# Patient Record
Sex: Female | Born: 1940
Health system: Southern US, Community
[De-identification: ages and names within clinical notes are randomized; demographics above are authoritative.]

## PROBLEM LIST (undated history)

## (undated) DIAGNOSIS — J449 Chronic obstructive pulmonary disease, unspecified: Secondary | ICD-10-CM

## (undated) DIAGNOSIS — I509 Heart failure, unspecified: Secondary | ICD-10-CM

---

## 2011-09-14 ENCOUNTER — Emergency Department (HOSPITAL_COMMUNITY): Payer: Medicare Other

## 2011-09-14 ENCOUNTER — Inpatient Hospital Stay (HOSPITAL_COMMUNITY)
Admission: EM | Admit: 2011-09-14 | Discharge: 2011-09-22 | DRG: 190 | Disposition: A | Discharge status: Home Health | Payer: Medicare Other | Attending: Internal Medicine | Admitting: Internal Medicine

## 2011-09-14 ENCOUNTER — Other Ambulatory Visit: Payer: Self-pay

## 2011-09-14 ENCOUNTER — Encounter (HOSPITAL_COMMUNITY): Payer: Self-pay | Admitting: Emergency Medicine

## 2011-09-14 DIAGNOSIS — E871 Hypo-osmolality and hyponatremia: Secondary | ICD-10-CM | POA: Diagnosis present

## 2011-09-14 DIAGNOSIS — I2510 Atherosclerotic heart disease of native coronary artery without angina pectoris: Secondary | ICD-10-CM | POA: Diagnosis present

## 2011-09-14 DIAGNOSIS — J9819 Other pulmonary collapse: Secondary | ICD-10-CM | POA: Diagnosis not present

## 2011-09-14 DIAGNOSIS — M7989 Other specified soft tissue disorders: Secondary | ICD-10-CM | POA: Diagnosis not present

## 2011-09-14 DIAGNOSIS — R0902 Hypoxemia: Secondary | ICD-10-CM | POA: Diagnosis not present

## 2011-09-14 DIAGNOSIS — I2789 Other specified pulmonary heart diseases: Secondary | ICD-10-CM | POA: Diagnosis present

## 2011-09-14 DIAGNOSIS — D45 Polycythemia vera: Secondary | ICD-10-CM | POA: Diagnosis present

## 2011-09-14 DIAGNOSIS — D751 Secondary polycythemia: Secondary | ICD-10-CM | POA: Diagnosis not present

## 2011-09-14 DIAGNOSIS — J449 Chronic obstructive pulmonary disease, unspecified: Secondary | ICD-10-CM | POA: Diagnosis not present

## 2011-09-14 DIAGNOSIS — I517 Cardiomegaly: Secondary | ICD-10-CM | POA: Diagnosis not present

## 2011-09-14 DIAGNOSIS — R601 Generalized edema: Secondary | ICD-10-CM | POA: Diagnosis present

## 2011-09-14 DIAGNOSIS — I2781 Cor pulmonale (chronic): Secondary | ICD-10-CM | POA: Diagnosis present

## 2011-09-14 DIAGNOSIS — F17200 Nicotine dependence, unspecified, uncomplicated: Secondary | ICD-10-CM | POA: Diagnosis present

## 2011-09-14 DIAGNOSIS — I2609 Other pulmonary embolism with acute cor pulmonale: Secondary | ICD-10-CM | POA: Diagnosis not present

## 2011-09-14 DIAGNOSIS — J438 Other emphysema: Secondary | ICD-10-CM | POA: Diagnosis not present

## 2011-09-14 DIAGNOSIS — J984 Other disorders of lung: Secondary | ICD-10-CM | POA: Diagnosis not present

## 2011-09-14 DIAGNOSIS — J9620 Acute and chronic respiratory failure, unspecified whether with hypoxia or hypercapnia: Secondary | ICD-10-CM | POA: Diagnosis not present

## 2011-09-14 DIAGNOSIS — R911 Solitary pulmonary nodule: Secondary | ICD-10-CM | POA: Diagnosis present

## 2011-09-14 DIAGNOSIS — R609 Edema, unspecified: Secondary | ICD-10-CM | POA: Diagnosis not present

## 2011-09-14 DIAGNOSIS — J189 Pneumonia, unspecified organism: Secondary | ICD-10-CM | POA: Diagnosis present

## 2011-09-14 DIAGNOSIS — I509 Heart failure, unspecified: Secondary | ICD-10-CM | POA: Diagnosis not present

## 2011-09-14 DIAGNOSIS — J9621 Acute and chronic respiratory failure with hypoxia: Secondary | ICD-10-CM | POA: Diagnosis present

## 2011-09-14 DIAGNOSIS — J90 Pleural effusion, not elsewhere classified: Secondary | ICD-10-CM | POA: Diagnosis not present

## 2011-09-14 DIAGNOSIS — R918 Other nonspecific abnormal finding of lung field: Secondary | ICD-10-CM | POA: Diagnosis not present

## 2011-09-14 LAB — CBC
HCT: 53.3 % — ABNORMAL HIGH (ref 36.0–46.0)
Hemoglobin: 18.2 g/dL — ABNORMAL HIGH (ref 12.0–15.0)
MCH: 32 pg (ref 26.0–34.0)
MCHC: 34.1 g/dL (ref 30.0–36.0)
RBC: 5.69 MIL/uL — ABNORMAL HIGH (ref 3.87–5.11)

## 2011-09-14 LAB — COMPREHENSIVE METABOLIC PANEL
Albumin: 3.2 g/dL — ABNORMAL LOW (ref 3.5–5.2)
BUN: 15 mg/dL (ref 6–23)
Chloride: 94 mEq/L — ABNORMAL LOW (ref 96–112)
Creatinine, Ser: 0.51 mg/dL (ref 0.50–1.10)
GFR calc Af Amer: >90 mL/min (ref >90)
Glucose, Bld: 97 mg/dL (ref 70–99)
Total Bilirubin: 1.2 mg/dL (ref 0.3–1.2)

## 2011-09-14 LAB — URINALYSIS, ROUTINE W REFLEX MICROSCOPIC
Glucose, UA: NEGATIVE mg/dL
Ketones, ur: NEGATIVE mg/dL
Leukocytes, UA: NEGATIVE
Nitrite: NEGATIVE
Protein, ur: NEGATIVE mg/dL
Urobilinogen, UA: 0.2 mg/dL (ref 0.0–1.0)

## 2011-09-14 LAB — DIFFERENTIAL
Basophils Relative: 0 % (ref 0–1)
Eosinophils Absolute: 0 10*3/uL (ref 0.0–0.7)
Lymphs Abs: 0.6 10*3/uL — ABNORMAL LOW (ref 0.7–4.0)
Monocytes Absolute: 0.7 10*3/uL (ref 0.1–1.0)
Monocytes Relative: 8 % (ref 3–12)
Neutro Abs: 8.1 10*3/uL — ABNORMAL HIGH (ref 1.7–7.7)

## 2011-09-14 LAB — PRO B NATRIURETIC PEPTIDE: Pro B Natriuretic peptide (BNP): 5983 pg/mL — ABNORMAL HIGH (ref 0–125)

## 2011-09-14 MED ORDER — ONDANSETRON HCL 4 MG/2ML IJ SOLN: 4.0000 mg | Freq: Four times a day (QID) | INTRAMUSCULAR | Status: DC | PRN

## 2011-09-14 MED ORDER — FUROSEMIDE 10 MG/ML IJ SOLN
40.0000 mg | Freq: Four times a day (QID) | INTRAMUSCULAR | Status: DC
  Administered 2011-09-14 – 2011-09-15 (×4): 40 mg via INTRAVENOUS
  Filled 2011-09-14 (×10): qty 4

## 2011-09-14 MED ORDER — ONDANSETRON HCL 4 MG PO TABS: 4.0000 mg | ORAL_TABLET | Freq: Four times a day (QID) | ORAL | Status: DC | PRN

## 2011-09-14 MED ORDER — ALUM & MAG HYDROXIDE-SIMETH 200-200-20 MG/5ML PO SUSP: 30.0000 mL | Freq: Four times a day (QID) | ORAL | Status: DC | PRN

## 2011-09-14 MED ORDER — ACETAMINOPHEN 650 MG RE SUPP: 650.0000 mg | Freq: Four times a day (QID) | RECTAL | Status: DC | PRN

## 2011-09-14 MED ORDER — FUROSEMIDE 10 MG/ML IJ SOLN
40.0000 mg | Freq: Once | INTRAMUSCULAR | Status: AC
  Administered 2011-09-14: 40 mg via INTRAVENOUS
  Filled 2011-09-14 (×2): qty 4

## 2011-09-14 MED ORDER — NICOTINE 21 MG/24HR TD PT24
21.0000 mg | MEDICATED_PATCH | Freq: Every day | TRANSDERMAL | Status: DC
  Administered 2011-09-15 – 2011-09-16 (×2): 21 mg via TRANSDERMAL
  Filled 2011-09-14 (×2): qty 1

## 2011-09-14 MED ORDER — SODIUM CHLORIDE 0.9 % IJ SOLN
3.0000 mL | INTRAMUSCULAR | Status: DC | PRN
  Administered 2011-09-20: 3 mL via INTRAVENOUS

## 2011-09-14 MED ORDER — ACETAMINOPHEN 325 MG PO TABS
650.0000 mg | ORAL_TABLET | Freq: Four times a day (QID) | ORAL | Status: DC | PRN
  Administered 2011-09-16: 650 mg via ORAL
  Filled 2011-09-14: qty 2

## 2011-09-14 MED ORDER — HYDROCODONE-ACETAMINOPHEN 5-325 MG PO TABS: 1.0000 | ORAL_TABLET | ORAL | Status: DC | PRN

## 2011-09-14 MED ORDER — SODIUM CHLORIDE 0.9 % IJ SOLN
3.0000 mL | Freq: Two times a day (BID) | INTRAMUSCULAR | Status: DC
  Administered 2011-09-14 – 2011-09-21 (×12): 3 mL via INTRAVENOUS

## 2011-09-14 MED ORDER — ZOLPIDEM TARTRATE 5 MG PO TABS: 5.0000 mg | ORAL_TABLET | Freq: Every evening | ORAL | Status: DC | PRN

## 2011-09-14 MED ORDER — SODIUM CHLORIDE 0.9 % IV SOLN: 250.0000 mL | INTRAVENOUS | Status: DC | PRN

## 2011-09-14 MED ORDER — ENOXAPARIN SODIUM 40 MG/0.4ML ~~LOC~~ SOLN
40.0000 mg | Freq: Every day | SUBCUTANEOUS | Status: DC
  Administered 2011-09-15 – 2011-09-16 (×2): 40 mg via SUBCUTANEOUS
  Filled 2011-09-14 (×3): qty 0.4

## 2011-09-14 MED ORDER — BISACODYL 5 MG PO TBEC: 5.0000 mg | DELAYED_RELEASE_TABLET | Freq: Every day | ORAL | Status: DC | PRN

## 2011-09-14 MED ORDER — POLYETHYLENE GLYCOL 3350 17 G PO PACK
17.0000 g | PACK | Freq: Every day | ORAL | Status: DC | PRN
  Filled 2011-09-14: qty 1

## 2011-09-14 MED ORDER — SODIUM CHLORIDE 0.9 % IJ SOLN
3.0000 mL | Freq: Two times a day (BID) | INTRAMUSCULAR | Status: DC
  Administered 2011-09-15 – 2011-09-22 (×7): 3 mL via INTRAVENOUS

## 2011-09-14 NOTE — ED Provider Notes (Signed)
History     CSN: [id number]  Arrival date & time 09/14/11  1403   First MD Initiated Contact with Patient 09/14/11 1506      Chief Complaint  Patient presents with  . Leg Swelling    (Consider location/radiation/quality/duration/timing/severity/associated sxs/prior treatment) The history is provided by the patient.   patient states she's had leg swelling and pain for a while. She states it is worse with walking. No fevers. No cough. She states his been going for a while. It hurts more with walking. She states she has increased trouble breathing with walking. She does smoke a pack a day.  Past Medical History  Diagnosis Date  . Hypertension     History reviewed. No pertinent past surgical history.  Family History  Problem Relation Age of Onset  . Hypertension Father   . Hypertension Sister   . Diabetes Sister     History  Substance Use Topics  . Smoking status: Current Everyday Smoker -- 1.0 packs/day for 49 years    Types: Cigarettes  . Smokeless tobacco: Never Used  . Alcohol Use: No    OB History    Grav Para Term Preterm Abortions TAB SAB Ect Mult Living                  Review of Systems  Constitutional: Negative for appetite change.  Eyes: Negative for itching.  Respiratory: Positive for shortness of breath and wheezing. Negative for choking.   Cardiovascular: Positive for leg swelling. Negative for chest pain.  Gastrointestinal: Positive for abdominal distention. Negative for nausea.  Genitourinary: Negative for hematuria.  Musculoskeletal: Negative for back pain.  Psychiatric/Behavioral: Negative for agitation.    Allergies  Erythromycin; Amoxicillin; and Augmentin  Home Medications   Current Outpatient Rx  Name Route Sig Dispense Refill  . ACETAMINOPHEN 500 MG PO TABS Oral Take 1,000 mg by mouth every 6 (six) hours as needed. pain      BP 136/72  Pulse 78  Temp(Src) 98.1 F (36.7 C) (Oral)  Resp 18  SpO2 98%  Physical Exam  Nursing  note and vitals reviewed. Constitutional: She is oriented to person, place, and time. She appears well-developed and well-nourished.  HENT:  Head: Normocephalic and atraumatic.  Eyes: EOM are normal. Pupils are equal, round, and reactive to light.  Neck: Normal range of motion. Neck supple.  Cardiovascular: Normal rate, regular rhythm and normal heart sounds.   No murmur heard. Pulmonary/Chest: Effort normal. No respiratory distress. She has wheezes. She has no rales.       Diffuse wheezes and prolonged expirations  Abdominal: Soft. Bowel sounds are normal. She exhibits no distension. There is tenderness. There is no rebound and no guarding.        mild diffuse tenderness. Possible hepatomegaly. Some anasarca.   Musculoskeletal: Normal range of motion. She exhibits edema.       Moderate pitting edema from the thighs to the level of the knees bilaterally. Decreased edema with some wasting on bilateral lower extremities from the knees down. Decreased capillary refill from the knees down. Weak dorsalis pedis pulses. His palpable pulse behind her knees  Neurological: She is alert and oriented to person, place, and time. No cranial nerve deficit.  Skin: Skin is warm and dry.  Psychiatric: She has a normal mood and affect. Her speech is normal.    ED Course  Procedures (including critical care time)  Labs Reviewed  CBC - Abnormal; Notable for the following:    RBC 5.69 (*)  Hemoglobin 18.2 (*)    HCT 53.3 (*)    All other components within normal limits  DIFFERENTIAL - Abnormal; Notable for the following:    Neutrophils Relative 86 (*)    Neutro Abs 8.1 (*)    Lymphocytes Relative 6 (*)    Lymphs Abs 0.6 (*)    All other components within normal limits  COMPREHENSIVE METABOLIC PANEL - Abnormal; Notable for the following:    Sodium 133 (*)    Chloride 94 (*)    Total Protein 5.8 (*)    Albumin 3.2 (*)    All other components within normal limits  PRO B NATRIURETIC PEPTIDE -  Abnormal; Notable for the following:    Pro B Natriuretic peptide (BNP) 5983.0 (*)    All other components within normal limits  URINALYSIS, ROUTINE W REFLEX MICROSCOPIC  TROPONIN I   Dg Chest 2 View  09/14/2011  *RADIOLOGY REPORT*  Clinical Data: Edema.  Short of breath.  Cough.  CHEST - 2 VIEW  Comparison: None.  Findings: Emphysema is present with triangular opacity at the left apex.  Pulmonary nodule is not excluded.  Bilateral pleural effusions and basilar atelectasis.  Cardiomegaly.  Basilar airspace opacity is present, basilar opacities present likely representing interstitial pulmonary edema.  Monitoring leads are projected over the chest.  IMPRESSION: 1.  Emphysema. 2.  Cardiomegaly with bilateral effusions and basilar interstitial pulmonary edema most compatible with CHF. 3.  Linear left upper lobe density may represent scarring however pulmonary nodule is not excluded.  There are no priors available to assess for stability.  Follow-up noncontrast chest CT is recommended after acute illness to further evaluate.  Original Report Authenticated By: Andreas Newport, M.D.     1. CHF (congestive heart failure)     Date: 09/14/2011  Rate: 86  Rhythm: normal sinus rhythm  QRS Axis: normal  Intervals: normal  ST/T Wave abnormalities: nonspecific ST changes  Conduction Disutrbances:right bundle branch block  Narrative Interpretation:   Old EKG Reviewed: none available     MDM  Shortness of breath and swelling in h her legs. New onset CHF. She's not a primary care physician. She will be admitted to hospital for further management.        Juliet Rude. Rubin Payor, MD 09/14/11 [phone]

## 2011-09-14 NOTE — ED Notes (Signed)
Patient complains of swelling since Christmas, patient has noted swelling from abdomin to feet. Patient has had some shortness of breath at rest. Swelling has limited patients movements.

## 2011-09-14 NOTE — H&P (Signed)
History and Physical  Angel Hinton JXB:[id number] DOB: Dec 20, 1940 DOA: 09/14/2011  Referring physician: Benjiman Core, MD PCP: None.  Chief Complaint: Swelling  HPI:  71 year old woman presents the emergency department with leg swelling and pain as well as dyspnea on exertion. Noted to have anasarca, possible hepatomegaly, bilateral lower extremity edema, diffuse wheezes and prolonged expiration on examination by the emergency department physician. She was referred for admission for CHF.  The patient tells me that she has had swelling for several months now. It continued to get worse especially in the last few weeks. It has gotten to the point where now where she has difficulty bending her knees and ambulating. She came to the emergency department because she is having difficulty walking. 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 She complains especially of swelling in her legs and in her abdomen. She denies wearing support hose. Because of her duties as caretaker for her mother she has not had much time to rest her per her feet up.  She has not seen a physician in many years and is unaware of any medical problems.  Review of Systems:  Negative for fever, changes to her vision, sore throat, rash, muscle aches, chest pain, dysuria, bleeding, nausea, vomiting, abdominal pain.  Past medical history: Denies any past medical history.  Past surgical history: Denies any past surgeries of note.  Social History:  reports that she has been smoking Cigarettes.  She has a 49 pack-year smoking history. She has never used smokeless tobacco. She reports that she does not drink alcohol or use illicit drugs.  Allergies  Allergen Reactions  . Erythromycin Anaphylaxis  . Amoxicillin Other (See Comments)    Not sure  . Augmentin Other (See Comments)    Not sure    Family History  Problem Relation Age of Onset  . Hypertension Father   . Hypertension Sister   . Diabetes  Sister     Prior to Admission medications   Medication Sig Start Date End Date Taking? Authorizing Provider  acetaminophen (TYLENOL) 500 MG tablet Take 1,000 mg by mouth every 6 (six) hours as needed. pain   Yes Historical Provider, MD   Physical Exam: Filed Vitals:   09/14/11 1430 09/14/11 1500 09/14/11 1624 09/14/11 1630  BP: 166/74 141/86 139/79 143/79  Pulse: 83 88 84 85  Temp: 97.8 F (36.6 C)  98.1 F (36.7 C)   TempSrc:   Oral   Resp: 22 22 25 23   SpO2: 93% 94% 97% 97%     General:  Appears calm and comfortable. Nontoxic.  Eyes: Pupils equal, round and reactive light. Normal lids, irises, conjunctiva.  ENT: Grossly normal hearing. Lips and tongue appear unremarkable. Poor dentition.  Neck: No lymphadenopathy or masses. No thyromegaly.  Cardiovascular: Regular rate and rhythm. No murmur, rub, gallop. 1+ bilateral lower extremity edema from the foot to the knee, however there is 3+ edema from knee to abdomen even extending to the chest.  Respiratory: Clear to auscultation bilaterally. No wheezes, rales or rhonchi. Normal respiratory effort.  Abdomen: Soft, nontender, nondistended. Anasarca is seen.  Skin: Skin appears grossly unremarkable. She has bilateral chronic skin changes to her lower legs.  Musculoskeletal: Bilateral lower choice display symmetric weakness 4/5.  Psychiatric: Grossly normal mood and affect. Speech fluent and appropriate.  Neurologic: Grossly nonfocal.  Labs on Admission:  Basic Metabolic Panel:  Lab 09/14/11 1308  NA 133*  K 4.6  CL 94*  CO2 31  GLUCOSE  97  BUN 15  CREATININE 0.51  CALCIUM 8.7  MG --  PHOS --   Liver Function Tests:  Lab 09/14/11 1535  AST 13  ALT 14  ALKPHOS 75  BILITOT 1.2  PROT 5.8*  ALBUMIN 3.2*   CBC:  Lab 09/14/11 1535  WBC 9.4  NEUTROABS 8.1*  HGB 18.2*  HCT 53.3*  MCV 93.7  PLT 217   Cardiac Enzymes:  Lab 09/14/11 1535  CKTOTAL --  CKMB --  CKMBINDEX --  TROPONINI <0.30     Radiological Exams on Admission: Dg Chest 2 View  09/14/2011  *RADIOLOGY REPORT*  Clinical Data: Edema.  Short of breath.  Cough.  CHEST - 2 VIEW  Comparison: None.  Findings: Emphysema is present with triangular opacity at the left apex.  Pulmonary nodule is not excluded.  Bilateral pleural effusions and basilar atelectasis.  Cardiomegaly.  Basilar airspace opacity is present, basilar opacities present likely representing interstitial pulmonary edema.  Monitoring leads are projected over the chest.  IMPRESSION: 1.  Emphysema. 2.  Cardiomegaly with bilateral effusions and basilar interstitial pulmonary edema most compatible with CHF. 3.  Linear left upper lobe density may represent scarring however pulmonary nodule is not excluded.  There are no priors available to assess for stability.  Follow-up noncontrast chest CT is recommended after acute illness to further evaluate.  Original Report Authenticated By: Andreas Newport, M.D.    EKG: Independently reviewed. Sinus rhythm, right bundle branch block, repolarization abnormality anterior leads, cannot rule out anterior and inferior MI. No acute changes seen.  Assessment/Plan 1. Dyspnea on exertion/anasarca: Etiology unclear. IV Lasix. Patient has mild hyponatremia. Differential would include: Decompensated congestive heart failure, cirrhosis. Favor heart failure at this point. Plan as below. 2. Presumed acute congestive heart failure, NOS: IV Lasix. 2-D echocardiogram. No recent chest pain. No further cardiac enzymes at this time. Consider beta blocker and ACE inhibitor depending on ejection fraction. Consider cardiology consultation based on ejection fraction findings and response to Lasix. 3. Polycythemia: Possibly secondary to ongoing cigarette use. Repeat CBC in the morning. 4. Possible pulmonary nodule: Repeat films in the outpatient setting. 5. Cigarette smoker: Nicotine patch.  Code Status: Full code.  Disposition Plan: Pending further  evaluation and treatment.  Brendia Sacks, MD  [facility] Pager [phone] 09/14/2011, 5:52 PM

## 2011-09-15 LAB — BASIC METABOLIC PANEL
CO2: 36 mEq/L — ABNORMAL HIGH (ref 19–32)
Chloride: 97 mEq/L (ref 96–112)
GFR calc Af Amer: >90 mL/min (ref >90)
Potassium: 3.9 mEq/L (ref 3.5–5.1)
Sodium: 139 mEq/L (ref 135–145)

## 2011-09-15 LAB — CBC
HCT: 53.5 % — ABNORMAL HIGH (ref 36.0–46.0)
MCV: 96.2 fL (ref 78.0–100.0)
RBC: 5.56 MIL/uL — ABNORMAL HIGH (ref 3.87–5.11)
WBC: 11.9 10*3/uL — ABNORMAL HIGH (ref 4.0–10.5)

## 2011-09-15 MED ORDER — FUROSEMIDE 40 MG PO TABS
40.0000 mg | ORAL_TABLET | Freq: Two times a day (BID) | ORAL | Status: DC
  Administered 2011-09-16: 40 mg via ORAL
  Filled 2011-09-15 (×2): qty 1

## 2011-09-15 NOTE — Progress Notes (Signed)
Patient ID: Angel Hinton, female   DOB: [birth year], 71 y.o.   MRN: [id number]  Subjective: No events overnight. Patient denies chest pain, shortness of breath, abdominal pain. Had bowel movement and reports ambulating.  Objective:  Vital signs in last 24 hours:  Filed Vitals:   10/06/2011 2122 09/15/11 0700 09/15/11 1500 09/15/11 2109  BP: 161/75 130/69 118/62 123/70  Pulse: 90 79 88 84  Temp: 97.6 F (36.4 C) 98 F (36.7 C) 97.9 F (36.6 C) 98 F (36.7 C)  TempSrc: Oral Oral Oral Oral  Resp: 20 20 20 19   Height: 5' 5.75" (1.67 m)     Weight: 50.3 kg (110 lb 14.3 oz) 48.4 kg (106 lb 11.2 oz)    SpO2: 90% 90% 91% 93%    Intake/Output from previous day:   Intake/Output Summary (Last 24 hours) at 09/15/11 2116 Last data filed at 09/15/11 2110  Gross per 24 hour  Intake   1043 ml  Output   7590 ml  Net  -6547 ml    Physical Exam: General: Alert, awake, oriented x3, in no acute distress. HEENT: No bruits, no goiter. Moist mucous membranes, no scleral icterus, no conjunctival pallor. Heart: Regular rate and rhythm, S1/S2 +, no murmurs, rubs, gallops. Lungs: Clear to auscultation bilaterally. No wheezing, no rhonchi, no rales.  Abdomen: Soft, nontender, nondistended, positive bowel sounds. Extremities: No clubbing or cyanosis, no pitting edema,  positive pedal pulses. Neuro: Grossly nonfocal.  Lab Results:  Basic Metabolic Panel:    Component Value Date/Time   NA 139 09/15/2011 0358   K 3.9 09/15/2011 0358   CL 97 09/15/2011 0358   CO2 36* 09/15/2011 0358   BUN 15 09/15/2011 0358   CREATININE 0.48* 09/15/2011 0358   GLUCOSE 117* 09/15/2011 0358   CALCIUM 8.6 09/15/2011 0358   CBC:    Component Value Date/Time   WBC 11.9* 09/15/2011 0358   HGB 17.9* 09/15/2011 0358   HCT 53.5* 09/15/2011 0358   PLT 215 09/15/2011 0358   MCV 96.2 09/15/2011 0358   NEUTROABS 8.1* 2011/10/06 1535   LYMPHSABS 0.6* 10-06-11 1535   MONOABS 0.7 2011-10-06 1535   EOSABS 0.0 10/06/11 1535   BASOSABS 0.0 06-Oct-2011 1535      Lab 09/15/11 0358 Oct 06, 2011 1535  WBC 11.9* 9.4  HGB 17.9* 18.2*  HCT 53.5* 53.3*  PLT 215 217  MCV 96.2 93.7  MCH 32.2 32.0  MCHC 33.5 34.1  RDW 14.8 14.5  LYMPHSABS -- 0.6*  MONOABS -- 0.7  EOSABS -- 0.0  BASOSABS -- 0.0  BANDABS -- --    Lab 09/15/11 0358 06-Oct-2011 1535  NA 139 133*  K 3.9 4.6  CL 97 94*  CO2 36* 31  GLUCOSE 117* 97  BUN 15 15  CREATININE 0.48* 0.51  CALCIUM 8.6 8.7  MG -- --   No results found for this basename: INR:5,PROTIME:5 in the last 168 hours Cardiac markers:  Lab 10-06-11 1535  CKMB --  TROPONINI <0.30  MYOGLOBIN --   No components found with this basename: POCBNP:3 No results found for this or any previous visit (from the past 240 hour(s)).  Studies/Results: Dg Chest 2 View 2011/10/06  IMPRESSION: 1.  Emphysema. 2.  Cardiomegaly with bilateral effusions and basilar interstitial pulmonary edema most compatible with CHF. 3.  Linear left upper lobe density may represent scarring however pulmonary nodule is not excluded.  There are no priors available to assess for stability.  Follow-up noncontrast chest CT is recommended after acute illness to  further evaluate.    Medications: Scheduled Meds:   . enoxaparin  40 mg Subcutaneous QAC breakfast  . furosemide  40 mg Intravenous Q6H  . nicotine  21 mg Transdermal Daily  . sodium chloride  3 mL Intravenous Q12H  . sodium chloride  3 mL Intravenous Q12H   Continuous Infusions:  PRN Meds:.sodium chloride, acetaminophen, acetaminophen, alum & mag hydroxide-simeth, bisacodyl, HYDROcodone-acetaminophen, ondansetron (ZOFRAN) IV, ondansetron, polyethylene glycol, sodium chloride, zolpidem  Assessment/Plan:  Principal Problem:  *CHF (congestive heart failure) - clinically stable this AM - pt maintaining good oxygen saturations and good urine output - her LE swelling is completely resolved and there are no crackles on exam - 2 D ECHO with grade I diastolic  dysfunction, CE x 3 negative  - continue to monitor clinically, continue Lasix PO  Active Problems:  Anasarca - unclear etiology but resolved this AM - continue to monitor clinically   Smoker - consultation for cessation obtained   ? Pulmonary nodule - noted on CXR - will obtain CT chest for further evaluation   EDUCATION - test results and diagnostic studies were discussed with patient and pt's family who was present at the bedside - patient and family have verbalized the understanding - questions were answered at the bedside and contact information was provided for additional questions or concerns   LOS: 1 day   MAGICK-Javarus Dorner 09/15/2011, 9:16 PM  [facility] Pager: [phone]

## 2011-09-15 NOTE — Progress Notes (Signed)
  Echocardiogram 2D Echocardiogram has been performed.  Angel Hinton 09/15/2011, 8:31 AM

## 2011-09-15 NOTE — Plan of Care (Signed)
Problem: Phase I Progression Outcomes Goal: Flu/PneumoVaccines if indicated Outcome: Not Applicable Date Met:  09/15/11 Pt declines

## 2011-09-16 ENCOUNTER — Inpatient Hospital Stay (HOSPITAL_COMMUNITY): Payer: Medicare Other

## 2011-09-16 ENCOUNTER — Encounter (HOSPITAL_COMMUNITY): Payer: Self-pay | Admitting: Radiology

## 2011-09-16 LAB — CBC
HCT: 53.2 % — ABNORMAL HIGH (ref 36.0–46.0)
Hemoglobin: 17.1 g/dL — ABNORMAL HIGH (ref 12.0–15.0)
MCH: 31.5 pg (ref 26.0–34.0)
MCHC: 32.1 g/dL (ref 30.0–36.0)
RBC: 5.42 MIL/uL — ABNORMAL HIGH (ref 3.87–5.11)

## 2011-09-16 LAB — BASIC METABOLIC PANEL
BUN: 13 mg/dL (ref 6–23)
CO2: 40 mEq/L (ref 19–32)
Calcium: 8.7 mg/dL (ref 8.4–10.5)
GFR calc non Af Amer: >90 mL/min (ref >90)
Glucose, Bld: 100 mg/dL — ABNORMAL HIGH (ref 70–99)
Potassium: 3.5 mEq/L (ref 3.5–5.1)

## 2011-09-16 MED ORDER — IPRATROPIUM BROMIDE 0.02 % IN SOLN
0.5000 mg | Freq: Four times a day (QID) | RESPIRATORY_TRACT | Status: DC
  Administered 2011-09-16 – 2011-09-18 (×9): 0.5 mg via RESPIRATORY_TRACT
  Filled 2011-09-16 (×8): qty 2.5

## 2011-09-16 MED ORDER — FUROSEMIDE 40 MG PO TABS
40.0000 mg | ORAL_TABLET | Freq: Every day | ORAL | Status: DC
  Administered 2011-09-17 – 2011-09-22 (×6): 40 mg via ORAL
  Filled 2011-09-16 (×7): qty 1

## 2011-09-16 MED ORDER — MOXIFLOXACIN HCL 400 MG PO TABS
400.0000 mg | ORAL_TABLET | Freq: Every day | ORAL | Status: DC
  Filled 2011-09-16: qty 1

## 2011-09-16 MED ORDER — ENOXAPARIN SODIUM 80 MG/0.8ML ~~LOC~~ SOLN
70.0000 mg | SUBCUTANEOUS | Status: DC
  Administered 2011-09-16 – 2011-09-17 (×2): 70 mg via SUBCUTANEOUS
  Filled 2011-09-16 (×3): qty 0.8

## 2011-09-16 MED ORDER — GUAIFENESIN ER 600 MG PO TB12
600.0000 mg | ORAL_TABLET | Freq: Two times a day (BID) | ORAL | Status: DC
  Administered 2011-09-16 – 2011-09-22 (×13): 600 mg via ORAL
  Filled 2011-09-16 (×16): qty 1

## 2011-09-16 MED ORDER — ALBUTEROL SULFATE (5 MG/ML) 0.5% IN NEBU
2.5000 mg | INHALATION_SOLUTION | Freq: Four times a day (QID) | RESPIRATORY_TRACT | Status: DC
  Administered 2011-09-16 – 2011-09-18 (×9): 2.5 mg via RESPIRATORY_TRACT
  Filled 2011-09-16 (×8): qty 0.5

## 2011-09-16 MED ORDER — ALBUTEROL SULFATE (5 MG/ML) 0.5% IN NEBU: 2.5000 mg | INHALATION_SOLUTION | RESPIRATORY_TRACT | Status: DC | PRN

## 2011-09-16 MED ORDER — MOXIFLOXACIN HCL IN NACL 400 MG/250ML IV SOLN
400.0000 mg | INTRAVENOUS | Status: AC
  Administered 2011-09-16 – 2011-09-17 (×2): 400 mg via INTRAVENOUS
  Filled 2011-09-16 (×2): qty 250

## 2011-09-16 MED ORDER — IPRATROPIUM BROMIDE 0.02 % IN SOLN: 0.5000 mg | RESPIRATORY_TRACT | Status: DC | PRN

## 2011-09-16 MED ORDER — IOHEXOL 300 MG/ML  SOLN
100.0000 mL | Freq: Once | INTRAMUSCULAR | Status: AC | PRN
  Administered 2011-09-16: 100 mL via INTRAVENOUS

## 2011-09-16 NOTE — Consult Note (Signed)
Admit date: 09/14/2011 Referring Physician  Dr. Brien Few Primary Physician  none Primary Cardiologist  Eldridge Dace Reason for Consultation  Edema, coronary calcifications  HPI: 71 y/o with a tobacco history who presented with significant lower extremity edema building over the last few weeks.  She is a long time smoker.  SHe has not seen a doctor or taken any medicines in 20 years.  She has relied on her faith in [place] to take care of her health problems in that time.    She has diuresed over 7 Liters in the past few days.  Echo was reviewed by me and showed severe RV dysfunction and RV enlargement.  PA pressure was only mildly elevated.  There was mild PI and the IVC was dilated.    Prior to this, she was very active at home.  No chest pain or shortness of breath.     PMH:  History reviewed. No pertinent past medical history.   PSH:  History reviewed. No pertinent past surgical history.  Allergies:  Erythromycin; Amoxicillin; and Augmentin Prior to Admit Meds:   Prescriptions prior to admission  Medication Sig Dispense Refill  . acetaminophen (TYLENOL) 500 MG tablet Take 1,000 mg by mouth every 6 (six) hours as needed. pain       Fam HX:    Family History  Problem Relation Age of Onset  . Hypertension Father   . Hypertension Sister   . Diabetes Sister    Social HX:    History   Social History  . Marital Status: Widowed    Spouse Name: N/A    Number of Children: N/A  . Years of Education: N/A   Occupational History  . Not on file.   Social History Main Topics  . Smoking status: Current Everyday Smoker -- 1.0 packs/day for 49 years    Types: Cigarettes  . Smokeless tobacco: Never Used  . Alcohol Use: No  . Drug Use: No  . Sexually Active: No   Other Topics Concern  . Not on file   Social History Narrative  . No narrative on file     ROS:  All 11 ROS were addressed and are negative except what is stated in the HPI  Physical Exam: Blood pressure 94/55, pulse 90,  temperature 98.1 F (36.7 C), temperature source Oral, resp. rate 18, height 5' 5.75" (1.67 m), weight 48.4 kg (106 lb 11.2 oz), SpO2 100.00%.    General: Thin, in no acute distress Head:    Normal cephalic and atramatic  Lungs:  Rhonchi bilaterally Heart:  HRRR S1 S2  Abdomen: , abdomen soft and non-tender  Msk:  Back normal, normal gait. Extremities:  Tr edema.  Neuro: Alert and oriented X 3. Psych:  Good affect, responds appropriately    Labs:   Lab Results  Component Value Date   WBC 11.5* 09/16/2011   HGB 17.1* 09/16/2011   HCT 53.2* 09/16/2011   MCV 98.2 09/16/2011   PLT 208 09/16/2011    Lab 09/16/11 0455 09/14/11 1535  NA 138 --  K 3.5 --  CL 91* --  CO2 40* --  BUN 13 --  CREATININE 0.50 --  CALCIUM 8.7 --  PROT -- 5.8*  BILITOT -- 1.2  ALKPHOS -- 75  ALT -- 14  AST -- 13  GLUCOSE 100* --   No results found for this basename: PTT   No results found for this basename: INR, PROTIME   Lab Results  Component Value Date   TROPONINI <  0.30 09/14/2011     No results found for this basename: CHOL   No results found for this basename: HDL   No results found for this basename: LDLCALC   No results found for this basename: TRIG   No results found for this basename: CHOLHDL   No results found for this basename: LDLDIRECT      Radiology:  Ct Chest Wo Contrast  09/16/2011  *RADIOLOGY REPORT*  Clinical Data: Abnormal chest x-ray.  Evaluate left upper lung nodule.  Shortness of breath.  CT CHEST WITHOUT CONTRAST  Technique:  Multidetector CT imaging of the chest was performed following the standard protocol without IV contrast.  Comparison: Chest x-ray of 09/14/2011.  Findings:  Mediastinum:  Heart size is mildly enlarged, with appearance suggestive of right ventricular dilatation.  Calcifications of the aortic and mitral valves.  No pericardial fluid, thickening or calcification.  No acute abnormality of the thoracic aorta or other great vessels of the mediastinum  noted on this noncontrast CT examination.  There is atherosclerosis of the thoracic aorta, the great vessels of the mediastinum and the coronary arteries, including calcified atherosclerotic plaque in the left circumflex and right coronary arteries. No definite pathologically enlarged mediastinal or hilar lymph nodes on this noncontrast CT examination.  The esophagus is normal in appearance.  Lungs/Pleura: Small bilateral pleural effusions (right greater than left) layering dependently.  There is extensive volume loss and airspace consolidation in the right lower lobe.  To a lesser extent, there is volume loss and peribronchovascular ground-glass attenuation in the right middle lobe as well.  Background of moderate centrilobular emphysema and mild diffuse bronchial wall thickening.  The perceive nodular opacity in the apex of the left upper lobe appears to represent an area of pleural-based scarring (image 8 of series 5).  No definite suspicious appearing pulmonary nodule or mass is identified on today's examination.  Upper Abdomen: Small calcification in the right lobe of the liver consistent with a granuloma.  Extensive atherosclerosis of the visualized abdominal vasculature.  Musculoskeletal:  No aggressive appearing lytic or blastic lesions are noted in the visualized portions of the skeleton.  IMPRESSION:  1.  Apparent nodular opacity on recent chest x-ray corresponds with an area of pleural-based scarring in the left apex.  No definite suspicious appearing pulmonary nodules or masses are identified on today's examination.  If any outside chest x-rays are available, comparison with such films would be useful to confirm the stability of this apparent area of scarring. If no such films are available, repeat chest x-ray in 6 months would be recommended.  2.  Consolidation and volume loss in the right lower lobe and to a lesser extent in the right middle lobe, concerning for pneumonia. 3.  Small bilateral pleural  effusions (right greater than left), layering dependently. 4. There are calcifications of the the aortic and mitral valves. Echocardiographic correlation for evaluation of potential valvular dysfunction may be warranted if clinically indicated. 5. Atherosclerosis, including two-vessel coronary artery disease. Please note that although the presence of coronary artery calcium documents the presence of coronary artery disease, the severity of this disease and any potential stenosis cannot be assessed on this non-gated CT examination.  Assessment for potential risk factor modification, dietary therapy or pharmacologic therapy may be warranted, if clinically indicated.  Original Report Authenticated By: Florencia Reasons, M.D.    EKG:  NSR, RBBB, poor R wave progression  ASSESSMENT: Right sided heart failure-cor pulmonale  PLAN:  1) Based on echo, there is  severe RV dysfunction.  Most likely etiology is a pulmonary cause given her smoking history and high hematocrit.   Evidence of spontaneous echo contrast in the IVC concerning for thrombosis.  Possible chronic thromboembolic disease.  Would anticoagulate for now and check CT scan looking for PE. Continuous oxygen. If this is negative, she will need PFTs and likely right heart cath.  Discussed with patient and I am not sure that she will be agreeable to the heart cath.  No significant valvular heart disease that would explain her right heart findings.  2) Coronary calcifications show some degree of CAD.  Normal LVEF without RWMA.  SHe has no angina.  I think this is secondary right now until right heart failure sorted out.  3) Other possibilities would be rheumatologic in origin.  ANA panel would be reasonable, but I think pulmonary cause is more likely.     Corky Crafts., MD  09/16/2011  5:24 PM

## 2011-09-16 NOTE — Progress Notes (Signed)
Pt educated on blood thinner and its purpose. Pt also educated on foot/leg exercises in bed to help prevent blood clots and help maintain leg strength while in bed. Pt asking a lot of questions regarding her care, her diagnosis, vital signs, and labs. Pt exhibiting readiness to learn and to do better with her health. Pt is even speaking of getting a doctor.

## 2011-09-16 NOTE — Progress Notes (Signed)
Pt reported itching around IV site once this am with no fluids or meds running through IV. Pt did not have any visible signs of reaction. Continued to monitor pt, and pt stated that itching subsided after ice was applied. Pt reported itching a second time after pm dose of Avelox was started, however pt showed no symptoms of a reaction and no shortness of breath. Pt again stated that itching had subsided after ice pack was applied and infusion was complete. Will continue to monitor site, and pass on itching incidence to night RN.  Leonor Liv 09/16/2011 7:34 PM

## 2011-09-16 NOTE — Progress Notes (Signed)
Pt's O2 saturation while sleeping and lethargic was 72%, after being placed on 5 L of O2 and being encouraged to breathe, pt's saturation was 83%. Pt was then placed on a non-rebreather mask, and MD was notified. Pt did not report "feeling bad." On non-rebreather, pt's O2 saturation remained 96% and above. MD prescribed breathing treatments for pt. Will continue to monitor pt.  Leonor Liv 09/16/2011

## 2011-09-16 NOTE — Progress Notes (Signed)
Admitted w/sob. 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 Recommend PT/OT eval. May need HHRN-CHF protocal.

## 2011-09-16 NOTE — Progress Notes (Signed)
Subjective: Feels better. Has mild cough, with clear phlegm.  Objective: Vital signs in last 24 hours: Temp:  [97.9 F (36.6 C)-98.6 F (37 C)] 98.6 F (37 C) (01/30 0500) Pulse Rate:  [84-89] 89  (01/30 0500) Resp:  [19-20] 19  (01/29 2109) BP: (92-123)/(57-70) 92/57 mmHg (01/30 0500) SpO2:  [91 %-93 %] 93 % (01/29 2109) Weight change:  Last BM Date: 09/13/11  Intake/Output from previous day: 01/29 0701 - 01/30 0700 In: 1043 [P.O.:1040; I.V.:3] Out: 6140 [Urine:6140]     Physical Exam: General: Comfortable, alert, communicative, fully oriented, not short of breath at rest.  HEENT:  No clinical pallor, no jaundice, no conjunctival injection or discharge. Hydration appears fair. NECK:  Supple, JVP not seen, no carotid bruits, no palpable lymphadenopathy, no palpable goiter. CHEST:  Clinically clear to auscultation, no wheezes, no crackles. HEART:  Sounds 1 and 2 heard, normal, regular, no murmurs. ABDOMEN:  Flat, soft, non-tender, no palpable organomegaly, no palpable masses, normal bowel sounds. GENITALIA:  Not examined. LOWER EXTREMITIES:  Improved edema, palpable peripheral pulses. MUSCULOSKELETAL SYSTEM:  Generalized osteoarthritic changes, otherwise, normal. CENTRAL NERVOUS SYSTEM:  No focal neurologic deficit on gross examination.  Lab Results:  Basename 09/16/11 0455 09/15/11 0358  WBC 11.5* 11.9*  HGB 17.1* 17.9*  HCT 53.2* 53.5*  PLT 208 215    Basename 09/16/11 0455 09/15/11 0358  NA 138 139  K 3.5 3.9  CL 91* 97  CO2 40* 36*  GLUCOSE 100* 117*  BUN 13 15  CREATININE 0.50 0.48*  CALCIUM 8.7 8.6   No results found for this or any previous visit (from the past 240 hour(s)).   Studies/Results: Dg Chest 2 View  09/14/2011  *RADIOLOGY REPORT*  Clinical Data: Edema.  Short of breath.  Cough.  CHEST - 2 VIEW  Comparison: None.  Findings: Emphysema is present with triangular opacity at the left apex.  Pulmonary nodule is not excluded.  Bilateral pleural  effusions and basilar atelectasis.  Cardiomegaly.  Basilar airspace opacity is present, basilar opacities present likely representing interstitial pulmonary edema.  Monitoring leads are projected over the chest.  IMPRESSION: 1.  Emphysema. 2.  Cardiomegaly with bilateral effusions and basilar interstitial pulmonary edema most compatible with CHF. 3.  Linear left upper lobe density may represent scarring however pulmonary nodule is not excluded.  There are no priors available to assess for stability.  Follow-up noncontrast chest CT is recommended after acute illness to further evaluate.  Original Report Authenticated By: Andreas Newport, M.D.   Ct Chest Wo Contrast  09/16/2011  *RADIOLOGY REPORT*  Clinical Data: Abnormal chest x-ray.  Evaluate left upper lung nodule.  Shortness of breath.  CT CHEST WITHOUT CONTRAST  Technique:  Multidetector CT imaging of the chest was performed following the standard protocol without IV contrast.  Comparison: Chest x-ray of 09/14/2011.  Findings:  Mediastinum:  Heart size is mildly enlarged, with appearance suggestive of right ventricular dilatation.  Calcifications of the aortic and mitral valves.  No pericardial fluid, thickening or calcification.  No acute abnormality of the thoracic aorta or other great vessels of the mediastinum noted on this noncontrast CT examination.  There is atherosclerosis of the thoracic aorta, the great vessels of the mediastinum and the coronary arteries, including calcified atherosclerotic plaque in the left circumflex and right coronary arteries. No definite pathologically enlarged mediastinal or hilar lymph nodes on this noncontrast CT examination.  The esophagus is normal in appearance.  Lungs/Pleura: Small bilateral pleural effusions (right greater than left) layering  dependently.  There is extensive volume loss and airspace consolidation in the right lower lobe.  To a lesser extent, there is volume loss and peribronchovascular ground-glass  attenuation in the right middle lobe as well.  Background of moderate centrilobular emphysema and mild diffuse bronchial wall thickening.  The perceive nodular opacity in the apex of the left upper lobe appears to represent an area of pleural-based scarring (image 8 of series 5).  No definite suspicious appearing pulmonary nodule or mass is identified on today's examination.  Upper Abdomen: Small calcification in the right lobe of the liver consistent with a granuloma.  Extensive atherosclerosis of the visualized abdominal vasculature.  Musculoskeletal:  No aggressive appearing lytic or blastic lesions are noted in the visualized portions of the skeleton.  IMPRESSION:  1.  Apparent nodular opacity on recent chest x-ray corresponds with an area of pleural-based scarring in the left apex.  No definite suspicious appearing pulmonary nodules or masses are identified on today's examination.  If any outside chest x-rays are available, comparison with such films would be useful to confirm the stability of this apparent area of scarring. If no such films are available, repeat chest x-ray in 6 months would be recommended.  2.  Consolidation and volume loss in the right lower lobe and to a lesser extent in the right middle lobe, concerning for pneumonia. 3.  Small bilateral pleural effusions (right greater than left), layering dependently. 4. There are calcifications of the the aortic and mitral valves. Echocardiographic correlation for evaluation of potential valvular dysfunction may be warranted if clinically indicated. 5. Atherosclerosis, including two-vessel coronary artery disease. Please note that although the presence of coronary artery calcium documents the presence of coronary artery disease, the severity of this disease and any potential stenosis cannot be assessed on this non-gated CT examination.  Assessment for potential risk factor modification, dietary therapy or pharmacologic therapy may be warranted, if  clinically indicated.  Original Report Authenticated By: Florencia Reasons, M.D.    Medications: Scheduled Meds:   . enoxaparin  40 mg Subcutaneous QAC breakfast  . furosemide  40 mg Oral BID  . nicotine  21 mg Transdermal Daily  . sodium chloride  3 mL Intravenous Q12H  . sodium chloride  3 mL Intravenous Q12H  . DISCONTD: furosemide  40 mg Intravenous Q6H   Continuous Infusions:  PRN Meds:.sodium chloride, acetaminophen, acetaminophen, alum & mag hydroxide-simeth, bisacodyl, HYDROcodone-acetaminophen, ondansetron (ZOFRAN) IV, ondansetron, polyethylene glycol, sodium chloride, zolpidem  Assessment/Plan:  Principal Problem:  *CHF (congestive heart failure): Clinically improved on Lasix. 2D Echocardiogram of 09/15/11, showed estimated ejection fraction of 65% to 70%, no regional wall motion abnormalities. Doppler parameters are consistent with abnormal left ventricular relaxation (grade 1 diastolic dysfunction). The E/e' ratio is >10, suggesting elevated LV filling pressure. Will continue current management and follow BNP. Patient will likely benefit from Coreg, but BP is currently borderline, so will change lasix to 40 mg daily, and add low dose coreg possibly on 09/17/11. Will request cardiology consult, as this is newly diagnosed CHF. CT scan findings also suggest CAD. *Active Problems* 1. CAP: This is an incidental finding on chest CT scan of 09/16/11, which shows consolidation and volume loss in the right lower lobe and to a lesser extent in the right middle lobe, concerning for pneumonia. Patient has no pyrexia, wcc is borderline and patient does not appear unwell. Will commence oral Avelox. 2. Smoker. Counseled appropriately. Does not seem to be willing to quit. 3. Query pulmonary nodule: Not  substantiated. Per chest Ct, apparent nodular opacity on recent chest x-ray corresponds with an area of pleural-based scarring in the left apex. No definite suspicious appearing pulmonary nodules or  masses are identified on examination.     LOS: 2 days   Tayshun Gappa,CHRISTOPHER 09/16/2011, 9:50 AM

## 2011-09-16 NOTE — Progress Notes (Signed)
ANTICOAGULATION CONSULT NOTE - Initial Consult  Pharmacy Consult for Lovenox Indication: r/o pulmonary embolus  Allergies  Allergen Reactions  . Erythromycin Anaphylaxis  . Amoxicillin Other (See Comments)    Not sure  . Augmentin Other (See Comments)    Not sure    Patient Measurements: Height: 5' 5.75" (167 cm) Weight:  (pt refused weight due to weakness) IBW/kg (Calculated) : 58.72  Wt 48.4kg  Vital Signs: Temp: 98.1 F (36.7 C) (01/30 1452) Temp src: Oral (01/30 1452) BP: 94/55 mmHg (01/30 1452) Pulse Rate: 90  (01/30 1452)  Labs:  Basename 09/16/11 0455 09/15/11 0358 09/14/11 1535  HGB 17.1* 17.9* --  HCT 53.2* 53.5* 53.3*  PLT 208 215 217  APTT -- -- --  LABPROT -- -- --  INR -- -- --  HEPARINUNFRC -- -- --  CREATININE 0.50 0.48* 0.51  CKTOTAL -- -- --  CKMB -- -- --  TROPONINI -- -- <0.30   Estimated Creatinine Clearance: 50.7 ml/min (by C-G formula based on Cr of 0.5).  Medical History: History reviewed. No pertinent past medical history.  Medications:  Scheduled:    . ipratropium  0.5 mg Nebulization Q6H   And  . albuterol  2.5 mg Nebulization Q6H  . enoxaparin  40 mg Subcutaneous QAC breakfast  . furosemide  40 mg Oral Daily  . guaiFENesin  600 mg Oral BID  . moxifloxacin  400 mg Intravenous Q24H  . sodium chloride  3 mL Intravenous Q12H  . sodium chloride  3 mL Intravenous Q12H  . DISCONTD: furosemide  40 mg Intravenous Q6H  . DISCONTD: furosemide  40 mg Oral BID  . DISCONTD: moxifloxacin  400 mg Oral q1800  . DISCONTD: nicotine  21 mg Transdermal Daily    Assessment: 71 yo F admit with r/o PE Last dose of prophylaxis lovenox given 1/30 at 0900 Baseline renal function and CBC within normal limits  Goal of Therapy:  Lovenox per renal function   Plan:  Lovenox 70mg  SQ once daily Follow up SCr, CBC, and imaging for PE    Lynann Beaver PharmD  Pager [phone] 09/16/2011 6:20 PM

## 2011-09-16 NOTE — Progress Notes (Signed)
Critical CO2 of 40 called to on-call floor coverage for [facility].

## 2011-09-17 LAB — CBC
HCT: 59 % — ABNORMAL HIGH (ref 36.0–46.0)
MCH: 32.3 pg (ref 26.0–34.0)
MCV: 100.7 fL — ABNORMAL HIGH (ref 78.0–100.0)
Platelets: 191 10*3/uL (ref 150–400)
RBC: 5.86 MIL/uL — ABNORMAL HIGH (ref 3.87–5.11)
WBC: 10.6 10*3/uL — ABNORMAL HIGH (ref 4.0–10.5)

## 2011-09-17 LAB — BASIC METABOLIC PANEL
BUN: 14 mg/dL (ref 6–23)
CO2: 39 mEq/L — ABNORMAL HIGH (ref 19–32)
Calcium: 9.1 mg/dL (ref 8.4–10.5)
Chloride: 91 mEq/L — ABNORMAL LOW (ref 96–112)
Creatinine, Ser: 0.49 mg/dL — ABNORMAL LOW (ref 0.50–1.10)

## 2011-09-17 LAB — PRO B NATRIURETIC PEPTIDE: Pro B Natriuretic peptide (BNP): 2991 pg/mL — ABNORMAL HIGH (ref 0–125)

## 2011-09-17 MED ORDER — MOXIFLOXACIN HCL 400 MG PO TABS
400.0000 mg | ORAL_TABLET | Freq: Every day | ORAL | Status: DC
  Administered 2011-09-18 – 2011-09-21 (×4): 400 mg via ORAL
  Filled 2011-09-17 (×6): qty 1

## 2011-09-17 NOTE — Progress Notes (Signed)
Subjective: No new issues.  Objective: Vital signs in last 24 hours: Temp:  [97.8 F (36.6 C)-99.3 F (37.4 C)] 97.8 F (36.6 C) (01/31 1412) Pulse Rate:  [85-98] 85  (01/31 1412) Resp:  [16-20] 18  (01/31 1412) BP: (113-134)/(66-74) 113/68 mmHg (01/31 1412) SpO2:  [90 %-97 %] 90 % (01/31 1412) FiO2 (%):  [40 %-50 %] 50 % (01/31 1429) Weight:  [45 kg (99 lb 3.3 oz)] 45 kg (99 lb 3.3 oz) (01/31 0640) Weight change:  Last BM Date: 09/13/11  Intake/Output from previous day: 01/30 0701 - 01/31 0700 In: 1093 [P.O.:840; I.V.:3; IV Piggyback:250] Out: 1600 [Urine:1600] Total I/O In: 440 [P.O.:440] Out: 550 [Urine:550]   Physical Exam: General: Sitting in chair, with ventimask, comfortable, alert, communicative, fully oriented, not short of breath at rest.  HEENT:  No clinical pallor, no jaundice, no conjunctival injection or discharge. Hydration appears fair. NECK:  Supple, JVP not seen, no carotid bruits, no palpable lymphadenopathy, no palpable goiter. CHEST:  Clinically clear to auscultation, no wheezes, no crackles. HEART:  Sounds 1 and 2 heard, normal, regular, no murmurs. ABDOMEN:  Flat, soft, non-tender, no palpable organomegaly, no palpable masses, normal bowel sounds. GENITALIA:  Not examined. LOWER EXTREMITIES:  Mild edema, palpable peripheral pulses. MUSCULOSKELETAL SYSTEM:  Generalized osteoarthritic changes, otherwise, normal. CENTRAL NERVOUS SYSTEM:  No focal neurologic deficit on gross examination.  Lab Results:  Basename 09/17/11 0445 09/16/11 0455  WBC 10.6* 11.5*  HGB 18.9* 17.1*  HCT 59.0* 53.2*  PLT 191 208    Basename 09/17/11 0445 09/16/11 0455  NA 137 138  K 4.0 3.5  CL 91* 91*  CO2 39* 40*  GLUCOSE 95 100*  BUN 14 13  CREATININE 0.49* 0.50  CALCIUM 9.1 8.7   No results found for this or any previous visit (from the past 240 hour(s)).   Studies/Results: Ct Chest Wo Contrast  09/16/2011  *RADIOLOGY REPORT*  Clinical Data: Abnormal chest  x-ray.  Evaluate left upper lung nodule.  Shortness of breath.  CT CHEST WITHOUT CONTRAST  Technique:  Multidetector CT imaging of the chest was performed following the standard protocol without IV contrast.  Comparison: Chest x-ray of 09/14/2011.  Findings:  Mediastinum:  Heart size is mildly enlarged, with appearance suggestive of right ventricular dilatation.  Calcifications of the aortic and mitral valves.  No pericardial fluid, thickening or calcification.  No acute abnormality of the thoracic aorta or other great vessels of the mediastinum noted on this noncontrast CT examination.  There is atherosclerosis of the thoracic aorta, the great vessels of the mediastinum and the coronary arteries, including calcified atherosclerotic plaque in the left circumflex and right coronary arteries. No definite pathologically enlarged mediastinal or hilar lymph nodes on this noncontrast CT examination.  The esophagus is normal in appearance.  Lungs/Pleura: Small bilateral pleural effusions (right greater than left) layering dependently.  There is extensive volume loss and airspace consolidation in the right lower lobe.  To a lesser extent, there is volume loss and peribronchovascular ground-glass attenuation in the right middle lobe as well.  Background of moderate centrilobular emphysema and mild diffuse bronchial wall thickening.  The perceive nodular opacity in the apex of the left upper lobe appears to represent an area of pleural-based scarring (image 8 of series 5).  No definite suspicious appearing pulmonary nodule or mass is identified on today's examination.  Upper Abdomen: Small calcification in the right lobe of the liver consistent with a granuloma.  Extensive atherosclerosis of the visualized abdominal vasculature.  Musculoskeletal:  No aggressive appearing lytic or blastic lesions are noted in the visualized portions of the skeleton.  IMPRESSION:  1.  Apparent nodular opacity on recent chest x-ray corresponds  with an area of pleural-based scarring in the left apex.  No definite suspicious appearing pulmonary nodules or masses are identified on today's examination.  If any outside chest x-rays are available, comparison with such films would be useful to confirm the stability of this apparent area of scarring. If no such films are available, repeat chest x-ray in 6 months would be recommended.  2.  Consolidation and volume loss in the right lower lobe and to a lesser extent in the right middle lobe, concerning for pneumonia. 3.  Small bilateral pleural effusions (right greater than left), layering dependently. 4. There are calcifications of the the aortic and mitral valves. Echocardiographic correlation for evaluation of potential valvular dysfunction may be warranted if clinically indicated. 5. Atherosclerosis, including two-vessel coronary artery disease. Please note that although the presence of coronary artery calcium documents the presence of coronary artery disease, the severity of this disease and any potential stenosis cannot be assessed on this non-gated CT examination.  Assessment for potential risk factor modification, dietary therapy or pharmacologic therapy may be warranted, if clinically indicated.  Original Report Authenticated By: Florencia Reasons, M.D.   Ct Angio Chest W/cm &/or Wo Cm  09/16/2011  *RADIOLOGY REPORT*  Clinical Data: Lower extremity edema, shortness of breath, question pulmonary embolism or chronic thromboembolic disease  CT ANGIOGRAPHY CHEST  Technique:  Multidetector CT imaging of the chest using the standard protocol during bolus administration of intravenous contrast. Multiplanar reconstructed images including MIPs were obtained and reviewed to evaluate the vascular anatomy.  Contrast: OMNIPAQUE IOHEXOL 300 MG/ML IV SOLN  Comparison: 09/16/2011 noncontrast CT chest  Findings: Scattered atherosclerotic calcifications aorta without aneurysm or dissection. No thoracic adenopathy.  Pulmonary arteries patent. No evidence of pulmonary embolism. No definite areas of wall thickening, synechia, or calcification identified at the pulmonary arteries to suggest sequelae of chronic thromboembolic disease. Bilateral pleural effusions. Mildly prominent central pulmonary arteries. Bilateral lower lobe atelectasis. Atelectasis at base of right middle lobe as well. Severe emphysematous changes without infiltrate or pneumothorax. Fluid or mucus within lower lobe bronchi bilaterally. Mild thoracic scoliosis.  IMPRESSION: No evidence of pulmonary embolism. Scattered atherosclerotic calcifications aorta. Moderate sized bilateral pleural effusions with bilateral lower lobe and basilar right middle lobe atelectasis. Emphysematous changes with question fluid or mucus within lower lobe bronchi bilaterally.  Original Report Authenticated By: Lollie Marrow, M.D.    Medications: Scheduled Meds:    . ipratropium  0.5 mg Nebulization Q6H   And  . albuterol  2.5 mg Nebulization Q6H  . enoxaparin  70 mg Subcutaneous Q24H  . furosemide  40 mg Oral Daily  . guaiFENesin  600 mg Oral BID  . moxifloxacin  400 mg Intravenous Q24H  . sodium chloride  3 mL Intravenous Q12H  . sodium chloride  3 mL Intravenous Q12H  . DISCONTD: enoxaparin  40 mg Subcutaneous QAC breakfast   Continuous Infusions:  PRN Meds:.sodium chloride, acetaminophen, acetaminophen, albuterol, alum & mag hydroxide-simeth, bisacodyl, HYDROcodone-acetaminophen, iohexol, ipratropium, ondansetron (ZOFRAN) IV, ondansetron, polyethylene glycol, sodium chloride, zolpidem  Assessment/Plan:  Principal Problem:  *CHF (congestive heart failure): Clinically improved on Lasix. 2D Echocardiogram of 09/15/11, showed estimated ejection fraction of 65% to 70%, no regional wall motion abnormalities. Doppler parameters are consistent with abnormal left ventricular relaxation (grade 1 diastolic dysfunction). The E/e' ratio is >  10, suggesting elevated LV  filling pressure. Will continue current management and follow BNP. Patient may benefit from Coreg, and is on Lasix 40 mg daily Will defer to cardiologist. Dr Lance Muss, kindly provided cardiology consult, and his input is greatly appreciated. Patient appears to have cor pulmonale, due to severe emphysema and right heart cath is contemplated. Fortunately, CTA of 09/16/11, showed no evidence of PE. Patient will benefit from pulmonary consult. Have requested one. *Active Problems* 1. CAP: This is an incidental finding on chest CT scan of 09/16/11, which shows consolidation and volume loss in the right lower lobe and to a lesser extent in the right middle lobe, concerning for pneumonia. Patient has no pyrexia, wcc is borderline and patient does not appear unwell. Now on Avelox, day # 2/7. 2. Smoker. Counseled appropriately. Does not seem to be willing to quit. 3. Query pulmonary nodule: Not substantiated. Per chest Ct, apparent nodular opacity on recent chest x-ray corresponds with an area of pleural-based scarring in the left apex. No definite suspicious appearing pulmonary nodules or masses are identified on examination.     LOS: 3 days   Angel Hinton,CHRISTOPHER 09/17/2011, 4:15 PM

## 2011-09-17 NOTE — Progress Notes (Signed)
SUBJECTIVE:  No events.  Her PE protocol CT was negative for PE.  Severe emphysema noted.  OBJECTIVE:   Vitals:   Filed Vitals:   09/16/11 2134 09/17/11 0110 09/17/11 0640 09/17/11 0934  BP: 134/66  131/74   Pulse: 98  85   Temp: 99.3 F (37.4 C)  98.3 F (36.8 C)   TempSrc: Oral  Oral   Resp: 20  16   Height:      Weight:   45 kg (99 lb 3.3 oz)   SpO2: 97% 95% 94% 93%   I&O's:   Intake/Output Summary (Last 24 hours) at 09/17/11 1409 Last data filed at 09/17/11 1237  Gross per 24 hour  Intake   1290 ml  Output   1275 ml  Net     15 ml   TELEMETRY: Reviewed telemetry pt in NSR    PHYSICAL EXAM General: Well developed, well nourished, in no acute distress Head: Marland Kitchen   Normal cephalic and atramatic  Lungs:  No wheezing currently Heart:   HRRR S1 S2 Abdomen: abdomen soft Msk:  . Normal strength and tone for age. Extremities: No edema.   Neuro: sleepy   LABS: Basic Metabolic Panel:  Basename 09/17/11 0445 09/16/11 0455  NA 137 138  K 4.0 3.5  CL 91* 91*  CO2 39* 40*  GLUCOSE 95 100*  BUN 14 13  CREATININE 0.49* 0.50  CALCIUM 9.1 8.7  MG -- --  PHOS -- --   Liver Function Tests:  [facility] 09/14/11 1535  AST 13  ALT 14  ALKPHOS 75  BILITOT 1.2  PROT 5.8*  ALBUMIN 3.2*   No results found for this basename: LIPASE:2,AMYLASE:2 in the last 72 hours CBC:  Basename 09/17/11 0445 09/16/11 0455 09/14/11 1535  WBC 10.6* 11.5* --  NEUTROABS -- -- 8.1*  HGB 18.9* 17.1* --  HCT 59.0* 53.2* --  MCV 100.7* 98.2 --  PLT 191 208 --   Cardiac Enzymes:  Basename 09/14/11 1535  CKTOTAL --  CKMB --  CKMBINDEX --  TROPONINI <0.30   BNP: No components found with this basename: POCBNP:3 D-Dimer: No results found for this basename: DDIMER:2 in the last 72 hours Hemoglobin A1C: No results found for this basename: HGBA1C in the last 72 hours Fasting Lipid Panel: No results found for this basename: CHOL,HDL,LDLCALC,TRIG,CHOLHDL,LDLDIRECT in the last 72  hours Thyroid Function Tests: No results found for this basename: TSH,T4TOTAL,FREET3,T3FREE,THYROIDAB in the last 72 hours Anemia Panel: No results found for this basename: VITAMINB12,FOLATE,FERRITIN,TIBC,IRON,RETICCTPCT in the last 72 hours Coag Panel:   No results found for this basename: INR, PROTIME    RADIOLOGY: Dg Chest 2 View  09/14/2011  *RADIOLOGY REPORT*  Clinical Data: Edema.  Short of breath.  Cough.  CHEST - 2 VIEW  Comparison: None.  Findings: Emphysema is present with triangular opacity at the left apex.  Pulmonary nodule is not excluded.  Bilateral pleural effusions and basilar atelectasis.  Cardiomegaly.  Basilar airspace opacity is present, basilar opacities present likely representing interstitial pulmonary edema.  Monitoring leads are projected over the chest.  IMPRESSION: 1.  Emphysema. 2.  Cardiomegaly with bilateral effusions and basilar interstitial pulmonary edema most compatible with CHF. 3.  Linear left upper lobe density may represent scarring however pulmonary nodule is not excluded.  There are no priors available to assess for stability.  Follow-up noncontrast chest CT is recommended after acute illness to further evaluate.  Original Report Authenticated By: Andreas Newport, M.D.   Ct Chest Wo Contrast  09/16/2011  *RADIOLOGY REPORT*  Clinical Data: Abnormal chest x-ray.  Evaluate left upper lung nodule.  Shortness of breath.  CT CHEST WITHOUT CONTRAST  Technique:  Multidetector CT imaging of the chest was performed following the standard protocol without IV contrast.  Comparison: Chest x-ray of 09/14/2011.  Findings:  Mediastinum:  Heart size is mildly enlarged, with appearance suggestive of right ventricular dilatation.  Calcifications of the aortic and mitral valves.  No pericardial fluid, thickening or calcification.  No acute abnormality of the thoracic aorta or other great vessels of the mediastinum noted on this noncontrast CT examination.  There is atherosclerosis of  the thoracic aorta, the great vessels of the mediastinum and the coronary arteries, including calcified atherosclerotic plaque in the left circumflex and right coronary arteries. No definite pathologically enlarged mediastinal or hilar lymph nodes on this noncontrast CT examination.  The esophagus is normal in appearance.  Lungs/Pleura: Small bilateral pleural effusions (right greater than left) layering dependently.  There is extensive volume loss and airspace consolidation in the right lower lobe.  To a lesser extent, there is volume loss and peribronchovascular ground-glass attenuation in the right middle lobe as well.  Background of moderate centrilobular emphysema and mild diffuse bronchial wall thickening.  The perceive nodular opacity in the apex of the left upper lobe appears to represent an area of pleural-based scarring (image 8 of series 5).  No definite suspicious appearing pulmonary nodule or mass is identified on today's examination.  Upper Abdomen: Small calcification in the right lobe of the liver consistent with a granuloma.  Extensive atherosclerosis of the visualized abdominal vasculature.  Musculoskeletal:  No aggressive appearing lytic or blastic lesions are noted in the visualized portions of the skeleton.  IMPRESSION:  1.  Apparent nodular opacity on recent chest x-ray corresponds with an area of pleural-based scarring in the left apex.  No definite suspicious appearing pulmonary nodules or masses are identified on today's examination.  If any outside chest x-rays are available, comparison with such films would be useful to confirm the stability of this apparent area of scarring. If no such films are available, repeat chest x-ray in 6 months would be recommended.  2.  Consolidation and volume loss in the right lower lobe and to a lesser extent in the right middle lobe, concerning for pneumonia. 3.  Small bilateral pleural effusions (right greater than left), layering dependently. 4. There are  calcifications of the the aortic and mitral valves. Echocardiographic correlation for evaluation of potential valvular dysfunction may be warranted if clinically indicated. 5. Atherosclerosis, including two-vessel coronary artery disease. Please note that although the presence of coronary artery calcium documents the presence of coronary artery disease, the severity of this disease and any potential stenosis cannot be assessed on this non-gated CT examination.  Assessment for potential risk factor modification, dietary therapy or pharmacologic therapy may be warranted, if clinically indicated.  Original Report Authenticated By: Florencia Reasons, M.D.   Ct Angio Chest W/cm &/or Wo Cm  09/16/2011  *RADIOLOGY REPORT*  Clinical Data: Lower extremity edema, shortness of breath, question pulmonary embolism or chronic thromboembolic disease  CT ANGIOGRAPHY CHEST  Technique:  Multidetector CT imaging of the chest using the standard protocol during bolus administration of intravenous contrast. Multiplanar reconstructed images including MIPs were obtained and reviewed to evaluate the vascular anatomy.  Contrast: OMNIPAQUE IOHEXOL 300 MG/ML IV SOLN  Comparison: 09/16/2011 noncontrast CT chest  Findings: Scattered atherosclerotic calcifications aorta without aneurysm or dissection. No thoracic adenopathy. Pulmonary arteries  patent. No evidence of pulmonary embolism. No definite areas of wall thickening, synechia, or calcification identified at the pulmonary arteries to suggest sequelae of chronic thromboembolic disease. Bilateral pleural effusions. Mildly prominent central pulmonary arteries. Bilateral lower lobe atelectasis. Atelectasis at base of right middle lobe as well. Severe emphysematous changes without infiltrate or pneumothorax. Fluid or mucus within lower lobe bronchi bilaterally. Mild thoracic scoliosis.  IMPRESSION: No evidence of pulmonary embolism. Scattered atherosclerotic calcifications aorta.  Moderate sized bilateral pleural effusions with bilateral lower lobe and basilar right middle lobe atelectasis. Emphysematous changes with question fluid or mucus within lower lobe bronchi bilaterally.  Original Report Authenticated By: Lollie Marrow, M.D.      ASSESSMENT: Cor pulmonale- new diagnosis.  PLAN:  CT negative for PE.  Will change lovenox to DVT prophylaxis dose.   Severe emphysema by CT scan.  She desats easily while sleeping.  I suspect right sided heart failure is from severe pulmonary disease.  She will likely need continuous home oxygen.  She needs management of her emphysema with pulmonary.  Plan for right heart cath at some point.  Could be done as outpatient.  Will see about doing it as an inpatient if she is willing and is going to be here for a while.  She is quite sleepy now and cannot really discuss the procedure.  Will follow.  Corky Crafts., MD  09/17/2011  2:09 PM

## 2011-09-17 NOTE — Consult Note (Signed)
PCCM Consult Note  Full note to follow on 2/1  Asked by DR Oti to evaluate. She is a 71 yo longstanding smoker admitted with dyspnea and progressive edema. Her evaluation reveals severe emphysema with probable exacerbation, RLL and RML airspace disease suspicious for pneumonia, anasarca with R effusion likely due to secondary pulmonary hypertension from chronic untreated hypoxemia and lung disease. Her CT-PA did not show PE. Her TTE did show some evidence for possible diastolic dysfunction, her RV was hypertrophied but surprisingly RV fxn was intact.   I agree with her antiobiotics and with diuresis for total body volume overload. She will need O2 and bronchodilators as well as good pulmonary followup. I will check back on her formally tomorrow. Thank you for this consultation.   Levy Pupa, MD, PhD 09/17/2011, 4:56 PM [facility] [phone] or if no answer [phone]

## 2011-09-18 DIAGNOSIS — I2789 Other specified pulmonary heart diseases: Secondary | ICD-10-CM

## 2011-09-18 DIAGNOSIS — R0902 Hypoxemia: Secondary | ICD-10-CM

## 2011-09-18 DIAGNOSIS — J449 Chronic obstructive pulmonary disease, unspecified: Secondary | ICD-10-CM

## 2011-09-18 LAB — BASIC METABOLIC PANEL
Calcium: 8.5 mg/dL (ref 8.4–10.5)
GFR calc non Af Amer: >90 mL/min (ref >90)
Sodium: 137 mEq/L (ref 135–145)

## 2011-09-18 LAB — CBC
MCH: 31.2 pg (ref 26.0–34.0)
MCHC: 31 g/dL (ref 30.0–36.0)
Platelets: 181 10*3/uL (ref 150–400)

## 2011-09-18 LAB — PRO B NATRIURETIC PEPTIDE: Pro B Natriuretic peptide (BNP): 2576 pg/mL — ABNORMAL HIGH (ref 0–125)

## 2011-09-18 MED ORDER — ENOXAPARIN SODIUM 40 MG/0.4ML ~~LOC~~ SOLN
40.0000 mg | SUBCUTANEOUS | Status: DC
  Administered 2011-09-18 – 2011-09-20 (×3): 40 mg via SUBCUTANEOUS
  Filled 2011-09-18 (×4): qty 0.4

## 2011-09-18 MED ORDER — TIOTROPIUM BROMIDE MONOHYDRATE 18 MCG IN CAPS
18.0000 ug | ORAL_CAPSULE | Freq: Every day | RESPIRATORY_TRACT | Status: DC
  Administered 2011-09-19 – 2011-09-22 (×4): 18 ug via RESPIRATORY_TRACT
  Filled 2011-09-18 (×2): qty 5

## 2011-09-18 MED ORDER — ALBUTEROL SULFATE (5 MG/ML) 0.5% IN NEBU: 2.5000 mg | INHALATION_SOLUTION | RESPIRATORY_TRACT | Status: DC | PRN

## 2011-09-18 NOTE — Progress Notes (Signed)
Discussed case with Dr. Delton Coombes.  Agree that her right heart failure is due to lung disease.  The focus will be on treating her emphysema.  Diuretics can be used as needed for edema.  Will not pursue right heart cath at this time.  WOuld be hesitant to use beta blocker given severe lung disease. Secondary pulmonary hypertension is certainly present.  If further cardiac evaluation is needed, please do not hesitate to call. Ignatz Deis S. 09/18/2011

## 2011-09-18 NOTE — Consult Note (Signed)
Name: Angel Hinton MRN: [id number] DOB: 09-16-1940    LOS: 4  [facility]  History of Present Illness:  Asked by Dr Brien Few to evaluate on 1/31. She is a 71 yo longstanding smoker admitted on 1/28 with dyspnea and progressive edema. Her evaluation suggests probable underlying COPD,  RLL and RML airspace disease and  anasarca with R effusion likely due to secondary pulmonary hypertension from chronic untreated hypoxemia and lung disease and volume overload. Her CT-PA did not show PE. Her TTE did show some evidence for possible diastolic dysfunction, her RV was hypertrophied but surprisingly RV fxn was intact. Treatment to date has included: empiric antibiotics, supplemental oxygen, diuresis and bronchodilators, she has continued to improve.   Cultures:  Antibiotics: avelox 1/30>>>   Tests / Events: CT chest 1/30: No evidence of pulmonary embolism.Scattered atherosclerotic calcifications aorta. Moderate sized bilateral pleural effusions with bilateral lower lobe and basilar right middle lobe atelectasis. Emphysematous changes with question fluid or mucus within lower lobe bronchi bilaterally. ECHO 1/29: The LV cavity appears small and underfilled. Wall thickness was increased in a pattern of severe LVH. Systolic function was vigorous. The estimated ejection fraction was in the range of 65% to 70%. Wall motion was normal; there were no regional wall motion abnormalities. Doppler parameters are consistent with abnormal left ventricular relaxation (grade 1 diastolic dysfunction). Decreased RV fxn   History reviewed. No pertinent past medical history. History reviewed. No pertinent past surgical history. Prior to Admission medications   Medication Sig Start Date End Date Taking? Authorizing Provider  acetaminophen (TYLENOL) 500 MG tablet Take 1,000 mg by mouth every 6 (six) hours as needed. pain   Yes Historical Provider, MD   Allergies Allergies  Allergen Reactions  .  Erythromycin Anaphylaxis  . Amoxicillin Other (See Comments)    Not sure  . Augmentin Other (See Comments)    Not sure    Family History Family History  Problem Relation Age of Onset  . Hypertension Father   . Hypertension Sister   . Diabetes Sister     Social History  reports that she has been smoking Cigarettes.  She has a 49 pack-year smoking history. She has never used smokeless tobacco. She reports that she does not drink alcohol or use illicit drugs.  Review Of Systems   Review of Systems  Constitutional: + weight loss, gain, -night sweats,- Fevers,- chills, + fatigue .  HEENT: No headaches, visual changes, Difficulty swallowing, Tooth/dental problems, or Sore throat,  No sneezing, itching, ear ache, nasal congestion, post nasal drip, no visual complaints CV: No chest pain, + Orthopnea, PND, + swelling in lower extremities (better now), - dizziness, -palpitations, -syncope.  GI No heartburn, indigestion, abdominal pain, nausea, vomiting, diarrhea, change in bowel habits, loss of appetite, bloody stools.  Resp: chronic morning cough, No coughing up of blood. No change in color of mucus. + wheezing.  Skin: no rash or itching or icterus GU: no dysuria, change in color of urine, no urgency or frequency. No flank pain, no hematuria  MS: No joint pain or swelling. No decreased range of motion  Psych: No change in mood or affect. No depression or anxiety.  Neuro: no difficulty with speech, weakness, numbness, ataxia    Vital Signs: Temp:  [98.3 F (36.8 C)-98.5 F (36.9 C)] 98.5 F (36.9 C) (02/01 0529) Pulse Rate:  [80-86] 80  (02/01 0529) Resp:  [20] 20  (02/01 0529) BP: (104-154)/(52-75) 154/75 mmHg (02/01 0529) SpO2:  [87 %-93 %] 93 % (  02/01 0529) FiO2 (%):  [50 %] 50 % (02/01 0100) Weight:  [44.498 kg (98 lb 1.6 oz)] 44.498 kg (98 lb 1.6 oz) (02/01 0529)     I/O last 3 completed shifts: In: 1620 [P.O.:1120; IV Piggyback:500] Out: 2250 [Urine:2250]  Physical  Examination: General:  Frail white female, currently up in chair in NAD Neuro:  A&O, no focal def HEENT:  No JVD Cardiovascular:  rrr Lungs:  Exp wheeze, fine crackles in bases Abdomen:  nt Musculoskeletal:  Decreased edema  Ventilator settings: Vent Mode:  [-]  FiO2 (%):  [50 %] 50 %  Labs and Imaging:   Lab 09/18/11 0435 09/17/11 0445 09/16/11 0455  NA 137 137 138  K 4.1 4.0 3.5  CL 92* 91* 91*  CO2 37* 39* 40*  BUN 13 14 13   CREATININE 0.42* 0.49* 0.50  GLUCOSE 103* 95 100*    Lab 09/18/11 0435 09/17/11 0445 09/16/11 0455  HGB 15.3* 18.9* 17.1*  HCT 49.3* 59.0* 53.2*  WBC 7.5 10.6* 11.5*  PLT 181 191 208    Assessment and Plan: Multifactorial dyspnea in setting of volume overload, decompensated Cor pulmonale, diastolic dysfunction and severe underlying COPD. Suspect that her consolidative changes on CT scan are likely atelectasis, and not a PNA. She has responded favorably to diuretics. The driving force here is severe emphysematous COPD, resulting in cor pulmonale, effusions.  Recommendations: -agree with current  Focus on diuretics -would not proceed w/ Right heart cath... She likely does have secondary PAH, treatment in her situation should focus on treating what looks to be severe underlying lung disease -will need PFTs -will go ahead and start her on spiriva w/ Rescue SABA -have advised her at length about the benefits of smoking cessation  -need walking oximetry to determine home Oxygen requirements.   BABCOCK,PETE 09/18/2011, 2:20 PM  Levy Pupa, MD, PhD 09/18/2011, 4:26 PM [facility] [phone] or if no answer [phone]

## 2011-09-18 NOTE — Progress Notes (Signed)
Subjective: No new issues.  Objective: Vital signs in last 24 hours: Temp:  [97.8 F (36.6 C)-98.5 F (36.9 C)] 98.5 F (36.9 C) (02/01 0529) Pulse Rate:  [80-86] 80  (02/01 0529) Resp:  [18-20] 20  (02/01 0529) BP: (104-154)/(52-75) 154/75 mmHg (02/01 0529) SpO2:  [87 %-93 %] 93 % (02/01 0529) FiO2 (%):  [50 %] 50 % (02/01 0100) Weight:  [44.498 kg (98 lb 1.6 oz)] 44.498 kg (98 lb 1.6 oz) (02/01 0529) Weight change: -0.502 kg (-1 lb 1.7 oz) Last BM Date: 09/13/11  Intake/Output from previous day: 01/31 0701 - 02/01 0700 In: 1250 [P.O.:1000; IV Piggyback:250] Out: 1500 [Urine:1500] Total I/O In: 603 [P.O.:600; I.V.:3] Out: 1000 [Urine:1000]   Physical Exam: General: Sitting in chair, with ventimask, comfortable, alert, communicative, fully oriented, not short of breath at rest.  HEENT:  No clinical pallor, no jaundice, no conjunctival injection or discharge. Hydration appears fair. NECK:  Supple, JVP not seen, no carotid bruits, no palpable lymphadenopathy, no palpable goiter. CHEST:  Clinically clear to auscultation, no wheezes, no crackles. HEART:  Sounds 1 and 2 heard, normal, regular, no murmurs. ABDOMEN:  Flat, soft, non-tender, no palpable organomegaly, no palpable masses, normal bowel sounds. GENITALIA:  Not examined. LOWER EXTREMITIES:  Mild edema, palpable peripheral pulses. MUSCULOSKELETAL SYSTEM:  Generalized osteoarthritic changes, otherwise, normal. CENTRAL NERVOUS SYSTEM:  No focal neurologic deficit on gross examination.  Lab Results:  Basename 09/18/11 0435 09/17/11 0445  WBC 7.5 10.6*  HGB 15.3* 18.9*  HCT 49.3* 59.0*  PLT 181 191    Basename 09/18/11 0435 09/17/11 0445  NA 137 137  K 4.1 4.0  CL 92* 91*  CO2 37* 39*  GLUCOSE 103* 95  BUN 13 14  CREATININE 0.42* 0.49*  CALCIUM 8.5 9.1   No results found for this or any previous visit (from the past 240 hour(s)).   Studies/Results: Ct Angio Chest W/cm &/or Wo Cm  09/16/2011  *RADIOLOGY  REPORT*  Clinical Data: Lower extremity edema, shortness of breath, question pulmonary embolism or chronic thromboembolic disease  CT ANGIOGRAPHY CHEST  Technique:  Multidetector CT imaging of the chest using the standard protocol during bolus administration of intravenous contrast. Multiplanar reconstructed images including MIPs were obtained and reviewed to evaluate the vascular anatomy.  Contrast: OMNIPAQUE IOHEXOL 300 MG/ML IV SOLN  Comparison: 09/16/2011 noncontrast CT chest  Findings: Scattered atherosclerotic calcifications aorta without aneurysm or dissection. No thoracic adenopathy. Pulmonary arteries patent. No evidence of pulmonary embolism. No definite areas of wall thickening, synechia, or calcification identified at the pulmonary arteries to suggest sequelae of chronic thromboembolic disease. Bilateral pleural effusions. Mildly prominent central pulmonary arteries. Bilateral lower lobe atelectasis. Atelectasis at base of right middle lobe as well. Severe emphysematous changes without infiltrate or pneumothorax. Fluid or mucus within lower lobe bronchi bilaterally. Mild thoracic scoliosis.  IMPRESSION: No evidence of pulmonary embolism. Scattered atherosclerotic calcifications aorta. Moderate sized bilateral pleural effusions with bilateral lower lobe and basilar right middle lobe atelectasis. Emphysematous changes with question fluid or mucus within lower lobe bronchi bilaterally.  Original Report Authenticated By: Lollie Marrow, M.D.    Medications: Scheduled Meds:    . ipratropium  0.5 mg Nebulization Q6H   And  . albuterol  2.5 mg Nebulization Q6H  . enoxaparin (LOVENOX) injection  40 mg Subcutaneous Q24H  . furosemide  40 mg Oral Daily  . guaiFENesin  600 mg Oral BID  . moxifloxacin  400 mg Intravenous Q24H  . moxifloxacin  400 mg Oral  [id number]  . sodium chloride  3 mL Intravenous Q12H  . sodium chloride  3 mL Intravenous Q12H  . DISCONTD: enoxaparin  70 mg Subcutaneous Q24H    Continuous Infusions:  PRN Meds:.sodium chloride, acetaminophen, acetaminophen, albuterol, alum & mag hydroxide-simeth, bisacodyl, HYDROcodone-acetaminophen, ipratropium, ondansetron (ZOFRAN) IV, ondansetron, polyethylene glycol, sodium chloride, zolpidem  Assessment/Plan:  Principal Problem:  *CHF (congestive heart failure): Clinically improved on Lasix. 2D Echocardiogram of 09/15/11, showed estimated ejection fraction of 65% to 70%, no regional wall motion abnormalities. Doppler parameters are consistent with abnormal left ventricular relaxation (grade 1 diastolic dysfunction). The E/e' ratio is >10, suggesting elevated LV filling pressure. Will continue current management and follow BNP. Patient may benefit from Coreg, and is on Lasix 40 mg daily. Will defer to cardiologist. Dr Lance Muss, kindly provided cardiology consult, and his input is greatly appreciated. Patient appears to have cor pulmonale, due to severe emphysema and right heart cath is contemplated. Fortunately, CTA of 09/16/11, showed no evidence of PE. Pulmonary consult has been provided by Dr Levy Pupa. Will manage as recommended. *Active Problems* 1. CAP: This is an incidental finding on chest CT scan of 09/16/11, which shows consolidation and volume loss in the right lower lobe and to a lesser extent in the right middle lobe, concerning for pneumonia. Patient has no pyrexia, wcc is borderline and patient does not appear unwell. Now on Avelox, day # 3/7. 2. Smoker. Counseled appropriately. Does not seem to be willing to quit. 3. Query pulmonary nodule: Not substantiated. Per chest Ct, apparent nodular opacity on recent chest x-ray corresponds with an area of pleural-based scarring in the left apex. No definite suspicious appearing pulmonary nodules or masses are identified on examination.  Comment: Disposition, per Cardiology/Pulmonology.   LOS: 4 days   Angel Hinton,CHRISTOPHER 09/18/2011, 1:22 PM

## 2011-09-18 NOTE — Progress Notes (Signed)
At MD's request, pt was placed on nasal cannula and monitored on 4L of O2. When checked, O2 was below 80%. Pt was then placed on 6 and 8 liters of O2. Pt sustained at 80%. Pt was then immediately placed on the Venturi mask at 100% to increase oxygen saturation. Pt sustained 90-95% at 100%. Venturi mask was decreased to 50% and monitored. Pt sustained an O2 level at 96%, pt was then placed on the Venturi mask at 40%. Pt's O2 is sustaining at 92-94% on the Venturi mask at 40%. Will continue to monitor, will continue to use Venturi mask at 40%.  Leonor Liv 09/18/2011

## 2011-09-19 DIAGNOSIS — J9621 Acute and chronic respiratory failure with hypoxia: Secondary | ICD-10-CM | POA: Diagnosis present

## 2011-09-19 LAB — PRO B NATRIURETIC PEPTIDE: Pro B Natriuretic peptide (BNP): 2899 pg/mL — ABNORMAL HIGH (ref 0–125)

## 2011-09-19 NOTE — Progress Notes (Signed)
Subjective: Continues to remain hypoxic.  Objective: Vital signs in last 24 hours: Temp:  [98 F (36.7 C)-98.2 F (36.8 C)] 98 F (36.7 C) (02/02 0515) Pulse Rate:  [82-83] 83  (02/02 0515) Resp:  [18-24] 20  (02/02 0515) BP: (111-144)/(66-79) 144/79 mmHg (02/02 0515) SpO2:  [90 %-93 %] 90 % (02/02 1047) FiO2 (%):  [40 %-50 %] 40 % (02/02 0515) Weight:  [43.681 kg (96 lb 4.8 oz)] 43.681 kg (96 lb 4.8 oz) (02/02 0981) Weight change: -0.816 kg (-1 lb 12.8 oz) Last BM Date: 09/13/11  Intake/Output from previous day: 02/01 0701 - 02/02 0700 In: 603 [P.O.:600; I.V.:3] Out: 1650 [Urine:1650]     Physical Exam: General: Sitting in chair, with nasal cannula, comfortable, alert, communicative, fully oriented, not short of breath at rest.  HEENT:  No clinical pallor, has mild central cyanosis, no jaundice, no conjunctival injection or discharge. Hydration appears fair. NECK:  Supple, JVP not seen, no carotid bruits, no palpable lymphadenopathy, no palpable goiter. CHEST:  Clinically clear to auscultation, no wheezes, no crackles. HEART:  Sounds 1 and 2 heard, normal, regular, no murmurs. ABDOMEN:  Flat, soft, non-tender, no palpable organomegaly, no palpable masses, normal bowel sounds. GENITALIA:  Not examined. LOWER EXTREMITIES:  Mild edema, palpable peripheral pulses. MUSCULOSKELETAL SYSTEM:  Generalized osteoarthritic changes, otherwise, normal. CENTRAL NERVOUS SYSTEM:  No focal neurologic deficit on gross examination.  Lab Results:  Basename 09/18/11 0435 09/17/11 0445  WBC 7.5 10.6*  HGB 15.3* 18.9*  HCT 49.3* 59.0*  PLT 181 191    Basename 09/18/11 0435 09/17/11 0445  NA 137 137  K 4.1 4.0  CL 92* 91*  CO2 37* 39*  GLUCOSE 103* 95  BUN 13 14  CREATININE 0.42* 0.49*  CALCIUM 8.5 9.1   No results found for this or any previous visit (from the past 240 hour(s)).   Studies/Results: No results found.  Medications: Scheduled Meds:    . enoxaparin (LOVENOX)  injection  40 mg Subcutaneous Q24H  . furosemide  40 mg Oral Daily  . guaiFENesin  600 mg Oral BID  . moxifloxacin  400 mg Oral q1800  . sodium chloride  3 mL Intravenous Q12H  . sodium chloride  3 mL Intravenous Q12H  . tiotropium  18 mcg Inhalation Daily  . DISCONTD: albuterol  2.5 mg Nebulization Q6H  . DISCONTD: enoxaparin  70 mg Subcutaneous Q24H  . DISCONTD: ipratropium  0.5 mg Nebulization Q6H   Continuous Infusions:  PRN Meds:.sodium chloride, acetaminophen, acetaminophen, albuterol, alum & mag hydroxide-simeth, bisacodyl, HYDROcodone-acetaminophen, ondansetron (ZOFRAN) IV, ondansetron, polyethylene glycol, sodium chloride, zolpidem, DISCONTD: albuterol, DISCONTD: ipratropium  Assessment/Plan:  Principal Problem:  *CHF (congestive heart failure): Stable on Lasix. 2D Echocardiogram of 09/15/11, showed estimated ejection fraction of 65% to 70%, no regional wall motion abnormalities. Doppler parameters are consistent with abnormal left ventricular relaxation (grade 1 diastolic dysfunction). The E/e' ratio is >10, suggesting elevated LV filling pressure. Dr Lance Muss, kindly provided cardiology consult, and his input was greatly appreciated. Patient appears to have cor pulmonale, due to severe emphysema and following consultation with Dr Levy Pupa, pulmonologist, it is felt that right heart catheter, will be unhelpful, and that the primary focus should be on patient's lung disease, causing secondary pulmonary HTN. Fortunately, CTA of 09/16/11, showed no evidence of PE. Will manage as recommended, with diuresis, bronchodilation, and oxygen supplementation. *Active Problems* 1. CAP: This is an incidental finding on chest CT scan of 09/16/11, which shows consolidation and volume loss in the  right lower lobe and to a lesser extent in the right middle lobe, concerning for pneumonia. Patient has no pyrexia, wcc is borderline and patient does not appear unwell. Now on Avelox, day # 4/7. 2.  Smoker. Counseled appropriately. 3. Query pulmonary nodule: Not substantiated. Per chest Ct, apparent nodular opacity on recent chest x-ray corresponds with an area of pleural-based scarring in the left apex. No definite suspicious appearing pulmonary nodules or masses are identified on examination. 4. Acute on chronic hypoxic respiratory failure: Secondary to severe emphysema, pneumonia and pulmonary HTN. Patient will require home oxygen.  Comment: Disposition, per Pulmonology.   LOS: 5 days   Angel Hinton,Angel Hinton 09/19/2011, 11:45 AM

## 2011-09-20 LAB — CBC
HCT: 54.1 % — ABNORMAL HIGH (ref 36.0–46.0)
MCH: 31.4 pg (ref 26.0–34.0)
MCHC: 31.6 g/dL (ref 30.0–36.0)
RDW: 14.3 % (ref 11.5–15.5)

## 2011-09-20 LAB — BASIC METABOLIC PANEL
BUN: 16 mg/dL (ref 6–23)
Calcium: 9 mg/dL (ref 8.4–10.5)
GFR calc Af Amer: >90 mL/min (ref >90)
GFR calc non Af Amer: >90 mL/min (ref >90)
Potassium: 4 mEq/L (ref 3.5–5.1)
Sodium: 139 mEq/L (ref 135–145)

## 2011-09-20 NOTE — Progress Notes (Addendum)
CARE MANAGEMENT NOTE 09/20/2011  Patient:  [facility]   Account Number:  [id number]  Date Initiated:  09/20/2011  Documentation initiated by:  Nadalie Laughner  Subjective/Objective Assessment:   71 yo patient admitted with dyspnea on exertion. Pt does not have PCP.     Action/Plan:   Home when stable.   Anticipated DC Date:  09/22/2011   Anticipated DC Plan:  HOME W HOME HEALTH SERVICES  In-house referral  NA      DC Planning Services  CM consult      [facility] Choice  NA   Choice offered to / List presented to:  C-1 Patient   DME arranged  [facility]YGEN           Status of service:  In process, will continue to follow Medicare Important Message given?   (If response is "NO", the following Medicare IM given date fields will be blank) Date Medicare IM given:   Date Additional Medicare IM given:    Discharge Disposition:    Per UR Regulation:    Comments:  09/20/11 1115 Jahmai Finelli,RN,BSN [phone] CM spoke with pt concerning d/c planning. Per MD Oti pt will need oxygen therapy upon discharge. pt given [facility] choice list. Pt lives alone, states sister comes by often to visit. Pt needs education concerning oxygen r/t confusion of need to continue oxygen therapy upon discharge. pt currently on 6l n/c during interview. LOS 6 days. MD suggest [facility] for disease management. Copy of list left in shadow chart.

## 2011-09-20 NOTE — Progress Notes (Signed)
Subjective: Some clinical improvement.  Objective: Vital signs in last 24 hours: Temp:  [97.6 F (36.4 C)-98.2 F (36.8 C)] 98.2 F (36.8 C) (02/03 0616) Pulse Rate:  [72-76] 76  (02/03 0616) Resp:  [16-18] 16  (02/03 0616) BP: (102-108)/(65-68) 102/65 mmHg (02/03 0616) SpO2:  [90 %-94 %] 90 % (02/03 0925) Weight:  [42.4 kg (93 lb 7.6 oz)] 42.4 kg (93 lb 7.6 oz) (02/03 0616) Weight change: -1.281 kg (-2 lb 13.2 oz) Last BM Date: 09/20/11  Intake/Output from previous day: 02/02 0701 - 02/03 0700 In: 680 [P.O.:680] Out: 1230 [Urine:1230]     Physical Exam: General: Sitting in chair, with nasal cannula, comfortable, alert, communicative, fully oriented, not short of breath at rest.  HEENT:  No clinical pallor, has mild central cyanosis, no jaundice, no conjunctival injection or discharge. Hydration appears fair. NECK:  Supple, JVP not seen, no carotid bruits, no palpable lymphadenopathy, no palpable goiter. CHEST:  Clinically clear to auscultation, no wheezes, no crackles. HEART:  Sounds 1 and 2 heard, normal, regular, no murmurs. ABDOMEN:  Flat, soft, non-tender, no palpable organomegaly, no palpable masses, normal bowel sounds. GENITALIA:  Not examined. LOWER EXTREMITIES:  Mild edema, palpable peripheral pulses. MUSCULOSKELETAL SYSTEM:  Generalized osteoarthritic changes, otherwise, normal. CENTRAL NERVOUS SYSTEM:  No focal neurologic deficit on gross examination.  Lab Results:  Basename 09/20/11 0548 09/18/11 0435  WBC 6.2 7.5  HGB 17.1* 15.3*  HCT 54.1* 49.3*  PLT 181 181    Basename 09/20/11 0548 09/18/11 0435  NA 139 137  K 4.0 4.1  CL 95* 92*  CO2 39* 37*  GLUCOSE 100* 103*  BUN 16 13  CREATININE 0.36* 0.42*  CALCIUM 9.0 8.5   No results found for this or any previous visit (from the past 240 hour(s)).   Studies/Results: No results found.  Medications: Scheduled Meds:    . enoxaparin (LOVENOX) injection  40 mg Subcutaneous Q24H  . furosemide  40 mg  Oral Daily  . guaiFENesin  600 mg Oral BID  . moxifloxacin  400 mg Oral q1800  . sodium chloride  3 mL Intravenous Q12H  . sodium chloride  3 mL Intravenous Q12H  . tiotropium  18 mcg Inhalation Daily   Continuous Infusions:  PRN Meds:.sodium chloride, acetaminophen, acetaminophen, albuterol, alum & mag hydroxide-simeth, bisacodyl, HYDROcodone-acetaminophen, ondansetron (ZOFRAN) IV, ondansetron, polyethylene glycol, sodium chloride, zolpidem  Assessment/Plan:  Principal Problem:  *CHF (congestive heart failure): Stable on Lasix. 2D Echocardiogram of 09/15/11, showed estimated ejection fraction of 65% to 70%, no regional wall motion abnormalities. Doppler parameters are consistent with abnormal left ventricular relaxation (grade 1 diastolic dysfunction). The E/e' ratio is >10, suggesting elevated LV filling pressure. Dr Lance Muss, kindly provided cardiology consult, and his input was greatly appreciated. Patient appears to have cor pulmonale, due to severe emphysema and following consultation with Dr Levy Pupa, pulmonologist, it is felt that right heart catheter, will be unhelpful, and that the primary focus should be on patient's lung disease, causing secondary pulmonary HTN. Fortunately, CTA of 09/16/11, showed no evidence of PE. Managing as recommended, with diuresis, bronchodilation, and oxygen supplementation. *Active Problems* 1. CAP: This is an incidental finding on chest CT scan of 09/16/11, which shows consolidation and volume loss in the right lower lobe and to a lesser extent in the right middle lobe, concerning for pneumonia. Patient has no pyrexia, wcc is borderline and patient does not appear unwell. Now on Avelox, day # 5/7. 2. Smoker. Counseled appropriately. 3. Query pulmonary nodule: Not  substantiated. Per chest Ct, apparent nodular opacity on recent chest x-ray corresponds with an area of pleural-based scarring in the left apex. No definite suspicious appearing pulmonary  nodules or masses are identified on examination. 4. Acute on chronic hypoxic respiratory failure: Secondary to severe emphysema, pneumonia and pulmonary HTN. Patient will require home oxygen.  Comment: Disposition, per Pulmonology. Aim discharge, next few days. PT/OT eval.   LOS: 6 days   Missy Baksh,CHRISTOPHER 09/20/2011, 1:37 PM

## 2011-09-20 NOTE — Progress Notes (Signed)
Pt O2 sat 81-82 %on RA  sitting up in chair. Reapply O2 at 6L nasal cannula.

## 2011-09-21 DIAGNOSIS — I2781 Cor pulmonale (chronic): Secondary | ICD-10-CM | POA: Diagnosis present

## 2011-09-21 DIAGNOSIS — I2789 Other specified pulmonary heart diseases: Secondary | ICD-10-CM

## 2011-09-21 DIAGNOSIS — J449 Chronic obstructive pulmonary disease, unspecified: Secondary | ICD-10-CM

## 2011-09-21 LAB — BASIC METABOLIC PANEL
CO2: 40 mEq/L (ref 19–32)
Calcium: 9 mg/dL (ref 8.4–10.5)
Creatinine, Ser: 0.41 mg/dL — ABNORMAL LOW (ref 0.50–1.10)
GFR calc Af Amer: >90 mL/min (ref >90)
GFR calc non Af Amer: >90 mL/min (ref >90)
Sodium: 139 mEq/L (ref 135–145)

## 2011-09-21 LAB — CBC
MCH: 31.4 pg (ref 26.0–34.0)
Platelets: 188 10*3/uL (ref 150–400)
RBC: 5.16 MIL/uL — ABNORMAL HIGH (ref 3.87–5.11)
RDW: 14.3 % (ref 11.5–15.5)

## 2011-09-21 MED ORDER — ENOXAPARIN SODIUM 30 MG/0.3ML ~~LOC~~ SOLN
30.0000 mg | SUBCUTANEOUS | Status: DC
  Administered 2011-09-21: 30 mg via SUBCUTANEOUS
  Filled 2011-09-21 (×3): qty 0.3

## 2011-09-21 NOTE — Plan of Care (Signed)
Problem: Phase II Progression Outcomes Goal: Walk in hall or up in chair TID Outcome: Completed/Met Date Met:  09/21/11 O2 sats at rest: 84% RA; with ambulation: 79% RA; replaced [place] O2: 90% 6L. (per RN, MD requested O2 sat reading on RA with ambulation).

## 2011-09-21 NOTE — Evaluation (Signed)
Occupational Therapy Evaluation Patient Details Name: Angel Hinton MRN: [id number] DOB: May 04, 1941 Today's Date: 09/21/2011  Problem List:  Patient Active Problem List  Diagnoses  . CHF (congestive heart failure)  . Anasarca  . Polycythemia  . Smoker  . Respiratory failure, acute-on-chronic    Past Medical History: History reviewed. No pertinent past medical history. Past Surgical History: History reviewed. No pertinent past surgical history.  OT Assessment/Plan/Recommendation OT Assessment Clinical Impression Statement: This 71 year old female was admitted wtih CHF.  She has been independent with all adls, iadls and cares for her mother at home.  She is now supervision/set up for basic adls and is on 5 liters of 02.  She is appropriate for skilled OT in the acute setting and recommend HHOT for iadls at home.  Goals in acute are mod I for basic ADLS OT Recommendation/Assessment: Patient will need skilled OT in the acute care venue OT Problem List: Decreased strength;Decreased activity tolerance;Cardiopulmonary status limiting activity Barriers to Discharge: Other (comment) (pt's sister will continue to stay with her/mother) OT Therapy Diagnosis : Generalized weakness OT Plan OT Frequency: Min 2X/week OT Treatment/Interventions: Self-care/ADL training;Therapeutic activities;Patient/family education;Energy conservation OT Recommendation Follow Up Recommendations: Home health OT Equipment Recommended: None recommended by OT Individuals Consulted Consulted and Agree with Results and Recommendations: Patient OT Goals Acute Rehab OT Goals OT Goal Formulation: With patient Time For Goal Achievement: 2 weeks ADL Goals Pt Will Perform Grooming: with modified independence;Standing at sink ADL Goal: Grooming - Progress: Goal set today Pt Will Transfer to Toilet: with modified independence;Ambulation;Regular height toilet ADL Goal: Toilet Transfer - Progress: Goal set today Pt Will  Perform Toileting - Clothing Manipulation: Independently;Standing ADL Goal: Toileting - Clothing Manipulation - Progress: Goal set today Pt Will Perform Toileting - Hygiene: Independently;Standing at 3-in-1/toilet ADL Goal: Toileting - Hygiene - Progress: Goal set today Miscellaneous OT Goals Miscellaneous OT Goal #1: Pt will verbalize 2 energy conservation techniques OT Goal: Miscellaneous Goal #1 - Progress: Goal set today Miscellaneous OT Goal #2: Pt will stand at sink to wash hair at mod I level OT Goal: Miscellaneous Goal #2 - Progress: Goal set today  OT Evaluation Precautions/Restrictions  Restrictions Weight Bearing Restrictions: No Prior Functioning Home Living Bathroom Shower/Tub: Other (comment) (has a tub, but sponge bathes) Bathroom Toilet: Standard (mother's bathroom has elevated toilet seat) Prior Function Level of Independence: Independent with basic ADLs;Independent with gait;Independent with homemaking with ambulation Comments: takes care of her mother:  she does most things for her ADL ADL Grooming: Simulated;Supervision/safety Where Assessed - Grooming: Standing at sink Upper Body Bathing: Simulated;Set up Where Assessed - Upper Body Bathing: Sitting, chair;Supported Lower Body Bathing: Simulated;Supervision/safety Where Assessed - Lower Body Bathing: Sit to stand from chair Upper Body Dressing: Simulated;Set up Where Assessed - Upper Body Dressing: Sitting, chair;Supported Lower Body Dressing: Performed;Supervision/safety;Other (comment) (performed socks) Where Assessed - Lower Body Dressing: Sit to stand from chair Toilet Transfer: Simulated;Minimal assistance (min guard bed to chair) Toilet Transfer Method: Stand pivot Toileting - Clothing Manipulation: Simulated;Supervision/safety Where Assessed - Toileting Clothing Manipulation: Standing Toileting - Hygiene: Simulated;Supervision/safety Where Assessed - Toileting Hygiene: Sit to stand from 3-in-1 or  toilet Ambulation Related to ADLs: took a few steps with min guard ADL Comments: Pt states she has an area on her buttocks with a dressing.  Discussed repositioning/sleeping on side and energy conservation.   Vision/Perception  Vision - History Baseline Vision: Wears glasses all the time Cognition Cognition Arousal/Alertness: Awake/alert Overall Cognitive Status: Appears within functional limits for  tasks assessed Orientation Level: Oriented X4 Sensation/Coordination   Extremity Assessment RUE Assessment RUE Assessment: Within Functional Limits LUE Assessment LUE Assessment: Within Functional Limits Mobility  Transfers Sit to Stand: 5: Supervision;Other (comment) (pt moves very quickly, but no LOB) Exercises   End of Session OT - End of Session Activity Tolerance: Patient tolerated treatment well;Other (comment) (doesn't feel back to baseline yet) Patient left: in chair;with call bell in reach General Behavior During Session: [facility] for tasks performed Cognition: [facility] for tasks performed [facility], OTR/L [phone] 09/21/2011  Angel Hinton 09/21/2011, 4:59 PM

## 2011-09-21 NOTE — Progress Notes (Signed)
Name: Theda Payer MRN: [id number] DOB: Aug 06, 1941    LOS: 7   Pulmonary/Critical Care  History of Present Illness:  Asked by Dr Brien Few to evaluate on 1/31. She is a 71 yo longstanding smoker admitted on 1/28 with dyspnea and progressive edema. Her evaluation suggests probable underlying COPD,  RLL and RML airspace disease and  anasarca with R effusion likely due to secondary pulmonary hypertension from chronic untreated hypoxemia and lung disease and volume overload. Her CT-PA did not show PE. Her TTE did show some evidence for possible diastolic dysfunction, her RV was hypertrophied but surprisingly RV fxn was intact. Treatment to date has included: empiric antibiotics, supplemental oxygen, diuresis and bronchodilators, she has continued to improve.   Cultures:  Antibiotics: avelox 1/30>>>   Tests / Events: CT chest 1/30: No evidence of pulmonary embolism.Scattered atherosclerotic calcifications aorta. Moderate sized bilateral pleural effusions with bilateral lower lobe and basilar right middle lobe atelectasis. Emphysematous changes with question fluid or mucus within lower lobe bronchi bilaterally. ECHO 1/29: The LV cavity appears small and underfilled. Wall thickness was increased in a pattern of severe LVH. Systolic function was vigorous. The estimated ejection fraction was in the range of 65% to 70%. Wall motion was normal; there were no regional wall motion abnormalities. Doppler parameters are consistent with abnormal left ventricular relaxation (grade 1 diastolic dysfunction). Decreased RV fxn  Subjective Feeling better.  Vital Signs: Temp:  [97.2 F (36.2 C)-98.8 F (37.1 C)] 97.6 F (36.4 C) (02/04 1436) Pulse Rate:  [70-79] 79  (02/04 1436) Resp:  [16-22] 20  (02/04 1436) BP: (107-133)/(68-75) 133/75 mmHg (02/04 1436) SpO2:  [87 %-96 %] 94 % (02/04 1436) Weight:  [42.2 kg (93 lb 0.6 oz)] 42.2 kg (93 lb 0.6 oz) (02/04 0532)     I/O last 3 completed shifts: In: 243  [P.O.:240; I.V.:3] Out: 1280 [Urine:1280]  Physical Examination: General:  Frail white female, currently up in chair in NAD Neuro:  A&O, no focal def HEENT:  No JVD Cardiovascular:  rrr Lungs:  Exp wheeze improved, fine crackles in bases Abdomen:  nt Musculoskeletal:  Decreased edema    Labs and Imaging:   Lab 09/21/11 0427 09/20/11 0548 09/18/11 0435  NA 139 139 137  K 3.8 4.0 4.1  CL 96 95* 92*  CO2 40* 39* 37*  BUN 19 16 13   CREATININE 0.41* 0.36* 0.42*  GLUCOSE 98 100* 103*    Lab 09/21/11 0427 09/20/11 0548 09/18/11 0435  HGB 16.2* 17.1* 15.3*  HCT 51.5* 54.1* 49.3*  WBC 6.7 6.2 7.5  PLT 188 181 181    Assessment and Plan: Multifactorial dyspnea in setting of volume overload, decompensated Cor pulmonale, diastolic dysfunction and severe underlying COPD. The driving force here is severe emphysematous COPD, resulting in cor pulmonale, effusions.  Recommendations: -home on oxygen 24/7 -daily spiriva w/ rescue SABA -will need PFTs in our office -have advised her at length about the benefits of smoking cessation  -f/u appointment w/ byrum made.   BABCOCK,PETE 09/21/2011, 3:27 PM  Deondra Labrador V.

## 2011-09-21 NOTE — Progress Notes (Signed)
   CARE MANAGEMENT NOTE 09/21/2011  Patient:  Angel Hinton   Account Number:  [id number]  Date Initiated:  09/20/2011  Documentation initiated by:  DAVIS,TYMEEKA  Subjective/Objective Assessment:   71 yo patient admitted with dyspnea on exertion. Pt does not have PCP.     Action/Plan:   Home when stable.   Anticipated DC Date:  09/22/2011   Anticipated DC Plan:  HOME W HOME HEALTH SERVICES  In-house referral  NA      DC Planning Services  CM consult      [facility] Choice  NA   Choice offered to / List presented to:  C-1 Patient   DME arranged  [facility]GEN        HH arranged  HH-1 RN  HH-2 PT  HH-3 OT  HH-4 NURSE'S AIDE      HH agency  [facility]   Status of service:  In process, will continue to follow Medicare Important Message given?   (If response is "NO", the following Medicare IM given date fields will be blank) Date Medicare IM given:   Date Additional Medicare IM given:    Discharge Disposition:    Per UR Regulation:  Reviewed for med. necessity/level of care/duration of stay  Comments:  09/21/11 Angel Tetro RN,BSN NCM [phone] [facility] CONTACTED FOR HH,& FOLLOWING.NOTED 02 SATS QUALIFY FOR HOME 02.PCP-DR. L.D.MITCHELL.  09/20/11 1115 Angel Hinton [phone] CM spoke with pt concerning d/c planning. Per MD Oti pt will need oxygen therapy upon discharge. pt given [facility] choice list. Pt lives alone, states sister comes by often to visit. Pt needs education concerning oxygen r/t confusion of need to contniue oxygen therapy upon discharge. pt currently on 6l n/c during interview. LOS 6 days. MD suggest [facility] for disease management. Copy of list left in shadow chart.

## 2011-09-21 NOTE — Plan of Care (Signed)
Problem: Phase II Progression Outcomes Goal: O2 sats > equal to 90% on RA or at baseline Outcome: Not Met (add Reason) Pt 02 on 5LNC 90%

## 2011-09-21 NOTE — Evaluation (Signed)
Physical Therapy Evaluation Patient Details Name: Angel Hinton MRN: [id number] DOB: 01-19-1941 Today's Date: 09/21/2011 Time: 1130-1150  Eval II Problem List:  Patient Active Problem List  Diagnoses  . CHF (congestive heart failure)  . Anasarca  . Polycythemia  . Smoker  . Respiratory failure, acute-on-chronic    Past Medical History: History reviewed. No pertinent past medical history. Past Surgical History: History reviewed. No pertinent past surgical history.  PT Assessment/Plan/Recommendation PT Assessment Clinical Impression Statement: Pt presents with diagnosis of CHF. Pt currently demonstrating decreased activity tolerance with decreae in O2 sats with activity. Pt will benefit from skilled PT in the acute care setting to maximize independence and activity tolerance with basic functional mobility in preperation for D/C home.  Pt provides total care for her mother. Recommended pt  have sister continue to assist with mother's care while pt is recovering.  PT Recommendation/Assessment: Patient will need skilled PT in the acute care venue PT Problem List: Decreased strength;Decreased activity tolerance;Decreased mobility Barriers to Discharge: Decreased caregiver support PT Therapy Diagnosis : Difficulty walking PT Plan PT Frequency: Min 3X/week PT Treatment/Interventions: Gait training;Functional mobility training;Therapeutic activities;Therapeutic exercise;Patient/family education PT Recommendation Recommendations for Other Services: OT consult Follow Up Recommendations: Home health PT Equipment Recommended: None recommended by PT PT Goals  Acute Rehab PT Goals PT Goal Formulation: With patient Time For Goal Achievement: 7 days Pt will Ambulate: 51 - 150 feet;with modified independence;with least restrictive assistive device PT Goal: Ambulate - Progress: Goal set today Pt will Go Up / Down Stairs: 3-5 stairs;with supervision;with rail(s) PT Goal: Up/Down Stairs - Progress:  Goal set today  PT Evaluation Precautions/Restrictions    Prior Functioning  Home Living Lives With:  (mother - pt takes care of mother) Home Adaptive Equipment: None Prior Function Level of Independence: Independent with basic ADLs;Independent with gait;Independent with homemaking with ambulation Cognition Cognition Arousal/Alertness: Awake/alert Overall Cognitive Status: Appears within functional limits for tasks assessed Orientation Level: Oriented X4 Sensation/Coordination Sensation Light Touch: Appears Intact Coordination Gross Motor Movements are Fluid and Coordinated: Yes Extremity Assessment RLE Assessment RLE Assessment: Within Functional Limits LLE Assessment LLE Assessment: Within Functional Limits Mobility (including Balance) Bed Mobility Bed Mobility: Yes Supine to Sit: 6: Modified independent (Device/Increase time) Transfers Transfers: Yes Sit to Stand: 6: Modified independent (Device/Increase time) Stand to Sit: 6: Modified independent (Device/Increase time) Ambulation/Gait Ambulation/Gait: Yes Ambulation/Gait Assistance Details (indicate cue type and reason): Min-guard assist. Slow gait speed. MD requested ambulation on RA, despite low sats at rest on O2. O2 sats dropped to 79% on RA with ambulation. Replaced O2/nasal cannula 6L O2.  Ambulation Distance (Feet): 50 Feet Assistive device: None Gait Pattern: Step-through pattern;Decreased step length - right;Decreased step length - left;Decreased stride length  Posture/Postural Control Posture/Postural Control: No significant limitations Exercise    End of Session PT - End of Session Equipment Utilized During Treatment: Gait belt Activity Tolerance: Patient limited by fatigue Patient left: in chair;with call bell in reach General Behavior During Session: [facility] for tasks performed Cognition: [facility] for tasks performed  Rebeca Alert [facility] 09/21/2011, 12:32 PM [phone]

## 2011-09-21 NOTE — Discharge Summary (Signed)
Physician Discharge Summary  Patient ID: Angel Hinton MRN: [id number] DOB/AGE: 1941-05-27 71 y.o.  Admit date: 09/14/2011 Discharge date: 09/22/2011  Primary Care Physician:  No primary provider on file.   Discharge Diagnoses:    Patient Active Problem List  Diagnoses  . CHF (congestive heart failure)  . Anasarca  . Polycythemia  . Smoker  . Respiratory failure, acute-on-chronic  . Cor pulmonale, chronic    Medication List  As of 09/22/2011  3:14 PM   TAKE these medications         acetaminophen 500 MG tablet   Commonly known as: TYLENOL   Take 1,000 mg by mouth every 6 (six) hours as needed. pain      albuterol (5 MG/ML) 0.5% nebulizer solution   Commonly known as: PROVENTIL   Take 0.5 mLs (2.5 mg total) by nebulization every 6 (six) hours as needed for wheezing or shortness of breath.      furosemide 40 MG tablet   Commonly known as: LASIX   Take 1 tablet (40 mg total) by mouth daily.      tiotropium 18 MCG inhalation capsule   Commonly known as: SPIRIVA   Place 1 capsule (18 mcg total) into inhaler and inhale daily.             Disposition and Follow-up: Follow up with Dr Levy Pupa, pulmonologist.  Consults:  cardiology and pulmonary/intensive care  Dr Levy Pupa. Dr Lance Muss, cardiologist.  Significant Diagnostic Studies:  Dg Chest 2 View  09/14/2011  *RADIOLOGY REPORT*  Clinical Data: Edema.  Short of breath.  Cough.  CHEST - 2 VIEW  Comparison: None.  Findings: Emphysema is present with triangular opacity at the left apex.  Pulmonary nodule is not excluded.  Bilateral pleural effusions and basilar atelectasis.  Cardiomegaly.  Basilar airspace opacity is present, basilar opacities present likely representing interstitial pulmonary edema.  Monitoring leads are projected over the chest.  IMPRESSION: 1.  Emphysema. 2.  Cardiomegaly with bilateral effusions and basilar interstitial pulmonary edema most compatible with CHF. 3.  Linear left upper lobe  density may represent scarring however pulmonary nodule is not excluded.  There are no priors available to assess for stability.  Follow-up noncontrast chest CT is recommended after acute illness to further evaluate.  Original Report Authenticated By: Andreas Newport, M.D.   Brief H and P: For complete details, refer to admission H and P. However, in brief, this is a 71 year old female, presenting with bilateral with leg swelling and pain, of several months duration, as well as dyspnea on exertion. On initial evaluation in the ED, she was noted to have anasarca, possible hepatomegaly, bilateral lower extremity edema, diffuse wheezes and prolonged expiration on examination by the emergency department physician. She was admitted for further evaluation, investigation and management.  Physical Exam: On 09/21/11. General: Sitting in chair, with nasal cannula, comfortable, alert, communicative, fully oriented, not short of breath at rest.  HEENT: No clinical pallor, has mild central cyanosis, no jaundice, no conjunctival injection or discharge. Hydration appears fair.  NECK: Supple, JVP not seen, no carotid bruits, no palpable lymphadenopathy, no palpable goiter.  CHEST: Clinically clear to auscultation, no wheezes, no crackles.  HEART: Sounds 1 and 2 heard, normal, regular, no murmurs.  ABDOMEN: Flat, soft, non-tender, no palpable organomegaly, no palpable masses, normal bowel sounds.  GENITALIA: Not examined.  LOWER EXTREMITIES: Minimal edema, palpable peripheral pulses.  MUSCULOSKELETAL SYSTEM: Generalized osteoarthritic changes, otherwise, normal.  CENTRAL NERVOUS SYSTEM: No focal neurologic deficit on gross  examination.  Hospital Course:  Principal Problem:  *CHF (congestive heart failure): Patient presented as described above, and was managed with intravenous diuretics, with satisfactory clinical response. She diuresed well, with dramatic diminution of edema and dyspnea. 2D Echocardiogram of 09/15/11,  showed estimated ejection fraction of 65% to 70%, no regional wall motion abnormalities. Doppler parameters are consistent with abnormal left ventricular relaxation (grade 1 diastolic dysfunction). The E/e' ratio is >10, suggesting elevated LV filling pressure. Dr Lance Muss, kindly provided cardiology consult. Patient appears to have cor pulmonale, due to severe emphysema and following consultation with Dr Levy Pupa, pulmonologist, it was felt that right heart catheter, will be unhelpful, and that the primary focus should be on patient's lung disease, causing secondary pulmonary HTN. Fortunately, CTA of 09/16/11, showed no evidence of PE. Per pulmonary recommendation, patient was managed with bronchodilation, oxygen supplementation and diuretics. *Active Problems*  1. Community-acquired pneumonia: This was an incidental finding on chest CT scan of 09/16/11, which shows consolidation and volume loss in the right lower lobe and to a lesser extent in the right middle lobe, concerning for pneumonia. Patient has no pyrexia, wcc remained borderline and patient did not appear septic. She was managed with a 7-day course of Avelox, to be completed on 09/22/11. 2. Smoker. Patient is a long term smoker. She was counseled appropriately, and managed with Nicoderm CQ, during her hospitalization.  3. Query pulmonary nodule: The was a suspicion of LUL pulmonary nodule on initial CXR of 09/14/11, but this was not substantiated. Per chest Ct, apparent nodular opacity on recent chest x-ray corresponds with an area of pleural-based scarring in the left apex. No definite suspicious appearing pulmonary nodules or masses were identified on examination.  4. Acute on chronic hypoxic respiratory failure: Secondary to severe emphysema, pneumonia and pulmonary HTN. Oxygen saturation was 81-82 % on RA at rest, and 79% RA on ambulation. Patient will require home oxygen 24/7, per pulmonary recommendations. This has been arranged. 5.  Deconditioning: Patient has poor effort tolerance, due to deconditioning and dyspnea, occasioned by her pulmonary status. She was evaluated by PT/OT, and HHPT, recommended.  Comment: Stable for discharge, on 09/22/11.   Time spent on Discharge: 45 mins.  Signed: Danasha Melman,CHRISTOPHER 09/22/2011, 3:14 PM

## 2011-09-21 NOTE — Progress Notes (Signed)
Subjective: No new issues.  Objective: Vital signs in last 24 hours: Temp:  [97.2 F (36.2 C)-98.8 F (37.1 C)] 97.6 F (36.4 C) (02/04 1436) Pulse Rate:  [70-79] 79  (02/04 1436) Resp:  [16-22] 20  (02/04 1436) BP: (107-133)/(68-75) 133/75 mmHg (02/04 1436) SpO2:  [87 %-96 %] 94 % (02/04 1436) Weight:  [42.2 kg (93 lb 0.6 oz)] 42.2 kg (93 lb 0.6 oz) (02/04 0532) Weight change: -0.2 kg (-7.1 oz) Last BM Date: 09/20/11  Intake/Output from previous day: 02/03 0701 - 02/04 0700 In: 3 [I.V.:3] Out: 1100 [Urine:1100] Total I/O In: 560 [P.O.:560] Out: 400 [Urine:400]   Physical Exam: General: Sitting in chair, with nasal cannula, comfortable, alert, communicative, fully oriented, not short of breath at rest.  HEENT:  No clinical pallor, has mild central cyanosis, no jaundice, no conjunctival injection or discharge. Hydration appears fair. NECK:  Supple, JVP not seen, no carotid bruits, no palpable lymphadenopathy, no palpable goiter. CHEST:  Clinically clear to auscultation, no wheezes, no crackles. HEART:  Sounds 1 and 2 heard, normal, regular, no murmurs. ABDOMEN:  Flat, soft, non-tender, no palpable organomegaly, no palpable masses, normal bowel sounds. GENITALIA:  Not examined. LOWER EXTREMITIES:  Minimal edema, palpable peripheral pulses. MUSCULOSKELETAL SYSTEM:  Generalized osteoarthritic changes, otherwise, normal. CENTRAL NERVOUS SYSTEM:  No focal neurologic deficit on gross examination.  Lab Results:  Basename 09/21/11 0427 09/20/11 0548  WBC 6.7 6.2  HGB 16.2* 17.1*  HCT 51.5* 54.1*  PLT 188 181    Basename 09/21/11 0427 09/20/11 0548  NA 139 139  K 3.8 4.0  CL 96 95*  CO2 40* 39*  GLUCOSE 98 100*  BUN 19 16  CREATININE 0.41* 0.36*  CALCIUM 9.0 9.0   No results found for this or any previous visit (from the past 240 hour(s)).   Studies/Results: No results found.  Medications: Scheduled Meds:    . enoxaparin (LOVENOX) injection  30 mg Subcutaneous  Q24H  . furosemide  40 mg Oral Daily  . guaiFENesin  600 mg Oral BID  . moxifloxacin  400 mg Oral q1800  . sodium chloride  3 mL Intravenous Q12H  . sodium chloride  3 mL Intravenous Q12H  . tiotropium  18 mcg Inhalation Daily  . DISCONTD: enoxaparin (LOVENOX) injection  40 mg Subcutaneous Q24H   Continuous Infusions:  PRN Meds:.sodium chloride, acetaminophen, acetaminophen, albuterol, alum & mag hydroxide-simeth, bisacodyl, HYDROcodone-acetaminophen, ondansetron (ZOFRAN) IV, ondansetron, polyethylene glycol, sodium chloride, zolpidem  Assessment/Plan:  Principal Problem:  *CHF (congestive heart failure): Stable on Lasix. 2D Echocardiogram of 09/15/11, showed estimated ejection fraction of 65% to 70%, no regional wall motion abnormalities. Doppler parameters are consistent with abnormal left ventricular relaxation (grade 1 diastolic dysfunction). The E/e' ratio is >10, suggesting elevated LV filling pressure. Dr Lance Muss, kindly provided cardiology consult, and his input was greatly appreciated. Patient appears to have cor pulmonale, due to severe emphysema and following consultation with Dr Levy Pupa, pulmonologist, it is felt that right heart catheter, will be unhelpful, and that the primary focus should be on patient's lung disease, causing secondary pulmonary HTN. Fortunately, CTA of 09/16/11, showed no evidence of PE. Managing as recommended, with diuresis, bronchodilation, and oxygen supplementation. *Active Problems* 1. CAP: This is an incidental finding on chest CT scan of 09/16/11, which shows consolidation and volume loss in the right lower lobe and to a lesser extent in the right middle lobe, concerning for pneumonia. Patient has no pyrexia, wcc is borderline and patient does not appear unwell.  Now on Avelox, day # 6/7. 2. Smoker. Counseled appropriately, managed with Nicoderm CQ. 3. Query pulmonary nodule: Not substantiated. Per chest Ct, apparent nodular opacity on recent  chest x-ray corresponds with an area of pleural-based scarring in the left apex. No definite suspicious appearing pulmonary nodules or masses are identified on examination. 4. Acute on chronic hypoxic respiratory failure: Secondary to severe emphysema, pneumonia and pulmonary HTN. Oxygen saturation was 81-82 % on RA at rest, and 79% RA on ambulation. Patient will require home oxygen 24/7, per pulmonary recommendations.  Comment:  Aim discharge, on 09/22/11.   LOS: 7 days   Angel Hinton,CHRISTOPHER 09/21/2011, 4:37 PM

## 2011-09-21 NOTE — Progress Notes (Signed)
CRITICAL VALUE ALERT  Critical value received: CO2-40  Date of notification:  09/21/11  Time of notification:  0530   Critical value read back:yes  Nurse who received alert:  Guy Franco, Rn  MD notified (1st page):  Benedetto Coons, NP  Time of first page:  [phone]   MD notified (2nd page):  Time of second page:  Responding MD:  Benedetto Coons, NP   Time MD responded:  [phone]

## 2011-09-22 LAB — BASIC METABOLIC PANEL
Calcium: 8.8 mg/dL (ref 8.4–10.5)
GFR calc Af Amer: >90 mL/min (ref >90)
GFR calc non Af Amer: >90 mL/min (ref >90)
Glucose, Bld: 89 mg/dL (ref 70–99)
Potassium: 4.1 mEq/L (ref 3.5–5.1)
Sodium: 142 mEq/L (ref 135–145)

## 2011-09-22 MED ORDER — TIOTROPIUM BROMIDE MONOHYDRATE 18 MCG IN CAPS: 18.0000 ug | ORAL_CAPSULE | Freq: Every day | RESPIRATORY_TRACT | Status: DC

## 2011-09-22 MED ORDER — FUROSEMIDE 40 MG PO TABS: 40.0000 mg | ORAL_TABLET | Freq: Every day | ORAL | Status: DC

## 2011-09-22 MED ORDER — ALBUTEROL SULFATE (5 MG/ML) 0.5% IN NEBU: 2.5000 mg | INHALATION_SOLUTION | Freq: Four times a day (QID) | RESPIRATORY_TRACT | Status: DC | PRN

## 2011-09-22 NOTE — Progress Notes (Signed)
Pt's O2 saturation is less than 88% on RA.

## 2011-09-22 NOTE — Progress Notes (Signed)
   CARE MANAGEMENT NOTE 09/22/2011  Patient:  [facility]   Account Number:  [id number]  Date Initiated:  09/20/2011  Documentation initiated by:  DAVIS,TYMEEKA  Subjective/Objective Assessment:   71 yo patient admitted with dyspnea on exertion. Pt does not have PCP.     Action/Plan:   Home when stable.   Anticipated DC Date:  09/22/2011   Anticipated DC Plan:  HOME W HOME HEALTH SERVICES  In-house referral  NA      DC Planning Services  CM consult      PAC Choice  NA   Choice offered to / List presented to:  C-1 Patient   DME arranged  OXYGEN  WALKER - ROLLING  NEBULIZER MACHINE  NEBULIZER/MEDS        HH arranged  HH-1 RN  HH-2 PT  HH-3 OT  HH-4 NURSE'S AIDE      HH agency  [facility]   Status of service:  Completed, signed off Medicare Important Message given?   (If response is "NO", the following Medicare IM given date fields will be blank) Date Medicare IM given:   Date Additional Medicare IM given:    Discharge Disposition:  HOME W HOME HEALTH SERVICES  Per UR Regulation:  Reviewed for med. necessity/level of care/duration of stay  Comments:  09/22/11 Taelon Bendorf RN,BSN NCM [phone] [facility] CONTACTED ABOUT D/C,& DME. 09/21/11 Biviana Saddler RN,BSN NCM [phone] [facility] CONTACTED FOR HH,& FOLLOWING.NOTED 02 SATS QUALIFY FOR HOME 02.PCP-DR. L.D.MITCHELL.  09/20/11 1115 Leonie Green [phone] CM spoke with pt concerning d/c planning. Per MD Oti pt will need oxygen therapy upon discharge. pt given [facility] choice list. Pt lives alone, states sister comes by often to visit. Pt needs education concerning oxygen r/t confusion of need to contniue oxygen therapy upon discharge. pt currently on 6l n/c during interview. LOS 6 days. MD suggest [facility] for disease management. Copy of list left in shadow chart.

## 2011-09-23 DIAGNOSIS — Z9981 Dependence on supplemental oxygen: Secondary | ICD-10-CM | POA: Diagnosis not present

## 2011-09-23 DIAGNOSIS — D45 Polycythemia vera: Secondary | ICD-10-CM | POA: Diagnosis not present

## 2011-09-23 DIAGNOSIS — J438 Other emphysema: Secondary | ICD-10-CM | POA: Diagnosis not present

## 2011-09-23 DIAGNOSIS — I279 Pulmonary heart disease, unspecified: Secondary | ICD-10-CM | POA: Diagnosis not present

## 2011-09-23 DIAGNOSIS — I509 Heart failure, unspecified: Secondary | ICD-10-CM | POA: Diagnosis not present

## 2011-09-23 DIAGNOSIS — I5030 Unspecified diastolic (congestive) heart failure: Secondary | ICD-10-CM | POA: Diagnosis not present

## 2011-09-26 DIAGNOSIS — I5030 Unspecified diastolic (congestive) heart failure: Secondary | ICD-10-CM | POA: Diagnosis not present

## 2011-09-26 DIAGNOSIS — I279 Pulmonary heart disease, unspecified: Secondary | ICD-10-CM | POA: Diagnosis not present

## 2011-09-26 DIAGNOSIS — I509 Heart failure, unspecified: Secondary | ICD-10-CM | POA: Diagnosis not present

## 2011-09-26 DIAGNOSIS — J438 Other emphysema: Secondary | ICD-10-CM | POA: Diagnosis not present

## 2011-09-26 DIAGNOSIS — Z9981 Dependence on supplemental oxygen: Secondary | ICD-10-CM | POA: Diagnosis not present

## 2011-09-26 DIAGNOSIS — D45 Polycythemia vera: Secondary | ICD-10-CM | POA: Diagnosis not present

## 2011-09-28 DIAGNOSIS — Z9981 Dependence on supplemental oxygen: Secondary | ICD-10-CM | POA: Diagnosis not present

## 2011-09-28 DIAGNOSIS — I509 Heart failure, unspecified: Secondary | ICD-10-CM | POA: Diagnosis not present

## 2011-09-28 DIAGNOSIS — I279 Pulmonary heart disease, unspecified: Secondary | ICD-10-CM | POA: Diagnosis not present

## 2011-09-28 DIAGNOSIS — I5030 Unspecified diastolic (congestive) heart failure: Secondary | ICD-10-CM | POA: Diagnosis not present

## 2011-09-28 DIAGNOSIS — D45 Polycythemia vera: Secondary | ICD-10-CM | POA: Diagnosis not present

## 2011-09-28 DIAGNOSIS — J438 Other emphysema: Secondary | ICD-10-CM | POA: Diagnosis not present

## 2011-09-29 DIAGNOSIS — I509 Heart failure, unspecified: Secondary | ICD-10-CM | POA: Diagnosis not present

## 2011-09-29 DIAGNOSIS — Z23 Encounter for immunization: Secondary | ICD-10-CM | POA: Diagnosis not present

## 2011-09-29 DIAGNOSIS — J449 Chronic obstructive pulmonary disease, unspecified: Secondary | ICD-10-CM | POA: Diagnosis not present

## 2011-09-30 DIAGNOSIS — D45 Polycythemia vera: Secondary | ICD-10-CM | POA: Diagnosis not present

## 2011-09-30 DIAGNOSIS — J438 Other emphysema: Secondary | ICD-10-CM | POA: Diagnosis not present

## 2011-09-30 DIAGNOSIS — I509 Heart failure, unspecified: Secondary | ICD-10-CM | POA: Diagnosis not present

## 2011-09-30 DIAGNOSIS — I5030 Unspecified diastolic (congestive) heart failure: Secondary | ICD-10-CM | POA: Diagnosis not present

## 2011-09-30 DIAGNOSIS — Z9981 Dependence on supplemental oxygen: Secondary | ICD-10-CM | POA: Diagnosis not present

## 2011-09-30 DIAGNOSIS — I279 Pulmonary heart disease, unspecified: Secondary | ICD-10-CM | POA: Diagnosis not present

## 2011-10-01 DIAGNOSIS — I279 Pulmonary heart disease, unspecified: Secondary | ICD-10-CM | POA: Diagnosis not present

## 2011-10-01 DIAGNOSIS — I5030 Unspecified diastolic (congestive) heart failure: Secondary | ICD-10-CM | POA: Diagnosis not present

## 2011-10-01 DIAGNOSIS — I509 Heart failure, unspecified: Secondary | ICD-10-CM | POA: Diagnosis not present

## 2011-10-01 DIAGNOSIS — Z9981 Dependence on supplemental oxygen: Secondary | ICD-10-CM | POA: Diagnosis not present

## 2011-10-01 DIAGNOSIS — J438 Other emphysema: Secondary | ICD-10-CM | POA: Diagnosis not present

## 2011-10-01 DIAGNOSIS — D45 Polycythemia vera: Secondary | ICD-10-CM | POA: Diagnosis not present

## 2011-10-06 DIAGNOSIS — E875 Hyperkalemia: Secondary | ICD-10-CM | POA: Diagnosis not present

## 2011-10-07 DIAGNOSIS — D45 Polycythemia vera: Secondary | ICD-10-CM | POA: Diagnosis not present

## 2011-10-07 DIAGNOSIS — I279 Pulmonary heart disease, unspecified: Secondary | ICD-10-CM | POA: Diagnosis not present

## 2011-10-07 DIAGNOSIS — Z9981 Dependence on supplemental oxygen: Secondary | ICD-10-CM | POA: Diagnosis not present

## 2011-10-07 DIAGNOSIS — I509 Heart failure, unspecified: Secondary | ICD-10-CM | POA: Diagnosis not present

## 2011-10-07 DIAGNOSIS — I5030 Unspecified diastolic (congestive) heart failure: Secondary | ICD-10-CM | POA: Diagnosis not present

## 2011-10-07 DIAGNOSIS — J438 Other emphysema: Secondary | ICD-10-CM | POA: Diagnosis not present

## 2011-10-22 ENCOUNTER — Inpatient Hospital Stay: Payer: PRIVATE HEALTH INSURANCE | Admitting: Emergency Medicine

## 2011-10-22 ENCOUNTER — Inpatient Hospital Stay: Payer: PRIVATE HEALTH INSURANCE | Admitting: Adult Health

## 2011-10-26 ENCOUNTER — Ambulatory Visit (INDEPENDENT_AMBULATORY_CARE_PROVIDER_SITE_OTHER): Payer: Medicare Other | Admitting: Adult Health

## 2011-10-26 ENCOUNTER — Encounter: Payer: Self-pay | Admitting: Adult Health

## 2011-10-26 DIAGNOSIS — J449 Chronic obstructive pulmonary disease, unspecified: Secondary | ICD-10-CM | POA: Insufficient documentation

## 2011-10-26 DIAGNOSIS — J189 Pneumonia, unspecified organism: Secondary | ICD-10-CM

## 2011-10-26 DIAGNOSIS — J9620 Acute and chronic respiratory failure, unspecified whether with hypoxia or hypercapnia: Secondary | ICD-10-CM

## 2011-10-26 DIAGNOSIS — I509 Heart failure, unspecified: Secondary | ICD-10-CM | POA: Diagnosis not present

## 2011-10-26 NOTE — Assessment & Plan Note (Signed)
Recent exacerbation with associated decompensated Diastolic dysfunction  Plan:   Continue on Spiriva daily  We are very proud of you for quitting smoking.  Wear Oxygen 2 l/m with activity and At bedtime   Please contact office for sooner follow up if symptoms do not improve or worsen or seek emergency care  follow up Dr. Delton Coombes  In 3 weeks with PFTs.

## 2011-10-26 NOTE — Patient Instructions (Signed)
Continue on Spiriva daily  We are very proud of you for quitting smoking.  Wear Oxygen 2 l/m with activity and At bedtime   Please contact office for sooner follow up if symptoms do not improve or worsen or seek emergency care  follow up Dr. Delton Coombes  In 3 weeks with PFTs.

## 2011-10-27 DIAGNOSIS — R238 Other skin changes: Secondary | ICD-10-CM | POA: Diagnosis not present

## 2011-10-27 DIAGNOSIS — J189 Pneumonia, unspecified organism: Secondary | ICD-10-CM | POA: Insufficient documentation

## 2011-10-27 DIAGNOSIS — J449 Chronic obstructive pulmonary disease, unspecified: Secondary | ICD-10-CM | POA: Diagnosis not present

## 2011-10-27 DIAGNOSIS — I509 Heart failure, unspecified: Secondary | ICD-10-CM | POA: Diagnosis not present

## 2011-10-27 NOTE — Assessment & Plan Note (Signed)
Wear O2 with activity and At bedtime   Advised on compliance

## 2011-10-27 NOTE — Assessment & Plan Note (Signed)
Will repeat xray today  Unfortunately she left prior to this being completed will recheck on return ov

## 2011-10-27 NOTE — Assessment & Plan Note (Signed)
Continue on diuretics  follow up cards as planned

## 2011-10-27 NOTE — Progress Notes (Signed)
  Subjective:    Patient ID: Angel Hinton, female    DOB: 1941-08-05, 71 y.o.   MRN: [id number]  HPI 71 female former smoker (09/14/2011)  seen for initial pulmonary\critical care consult during hospitalization. 09/18/2011 for acute on chronic hypoxic respiratory failure secondary to severe emphysema, pneumonia, pulmonary hypertension, and decompensated congestive heart failure.  10/27/2011 Post Hospital follow up  Patient returns for a post hospital followup. Patient was admitted January 28 through February 5 , for severe shortness of breath. Patient was found to have acute congestive heart failure. She responded well to diuresis. A 2-D echo showed an EF of 65-70%, no regional wall motion abnormalities. Noted to have grade 1 diastolic dysfunction. She was noted to have elevated pulmonary artery pressures and suspected to have secondary pulmonary hypertension. Due to her severe COPD. She underwent a CT chest angiogram that showed no evidence of pulmonary emboli. There was a noted   Right lower lobe consolidation consistent with pneumonia, and she was treated with antibiotics, with good response. He was counseled on smoking cessation. She was started on Spiriva at discharge and recommended to have continuous oxygen therapy at home.  Since discharge. Patient reports she is feeling improved with decreased shortness of breath and cough. She denies any increased swelling. No fever or orthopnea.  She is not wearing her O2 as recommended. Sats on arrival today 87% on room air. We discussed O2 compliance.     Review of Systems Constitutional:   No  weight loss, night sweats,  Fevers, chills,  +fatigue, or  lassitude.  HEENT:   No headaches,  Difficulty swallowing,  Tooth/dental problems, or  Sore throat,                No sneezing, itching, ear ache, nasal congestion, post nasal drip,   CV:  No chest pain,  Orthopnea, PND, swelling in lower extremities, anasarca, dizziness, palpitations, syncope.   GI   No heartburn, indigestion, abdominal pain, nausea, vomiting, diarrhea, change in bowel habits, loss of appetite, bloody stools.   Resp: No excess mucus, no productive cough,  No non-productive cough,  No coughing up of blood.  No change in color of mucus.  No wheezing.  No chest wall deformity  Skin: no rash or lesions.  GU: no dysuria, change in color of urine, no urgency or frequency.  No flank pain, no hematuria   MS:  No joint pain or swelling.  No decreased range of motion.  No back pain.  Psych:  No change in mood or affect. No depression or anxiety.  No memory loss.          Objective:   Physical Exam GEN: A/Ox3; pleasant , NAD, thin elderly female-frail   HEENT:  [place]/AT,  EACs-clear, TMs-wnl, NOSE-clear, THROAT-clear, no lesions, no postnasal drip or exudate noted.   NECK:  Supple w/ fair ROM; no JVD; normal carotid impulses w/o bruits; no thyromegaly or nodules palpated; no lymphadenopathy.  RESP  Coarse BS , diminished in bases , no accessory muscle use, no dullness to percussion  CARD:  RRR, no m/r/g  , no peripheral edema, pulses intact, no cyanosis or clubbing.  GI:   Soft & nt; nml bowel sounds; no organomegaly or masses detected.  Musco: Warm bil, no deformities or joint swelling noted.   Neuro: alert, no focal deficits noted.    Skin: Warm, no lesions or rashes         Assessment & Plan:

## 2011-11-09 DIAGNOSIS — L259 Unspecified contact dermatitis, unspecified cause: Secondary | ICD-10-CM | POA: Diagnosis not present

## 2011-11-27 ENCOUNTER — Ambulatory Visit (INDEPENDENT_AMBULATORY_CARE_PROVIDER_SITE_OTHER): Payer: Medicare Other | Admitting: Emergency Medicine

## 2011-11-27 ENCOUNTER — Encounter: Payer: Self-pay | Admitting: Emergency Medicine

## 2011-11-27 VITALS — BP 148/60 | HR 88 | Temp 98.1°F | Ht 67.0 in | Wt 94.0 lb

## 2011-11-27 DIAGNOSIS — J449 Chronic obstructive pulmonary disease, unspecified: Secondary | ICD-10-CM | POA: Diagnosis not present

## 2011-11-27 DIAGNOSIS — F17200 Nicotine dependence, unspecified, uncomplicated: Secondary | ICD-10-CM | POA: Diagnosis not present

## 2011-11-27 LAB — PULMONARY FUNCTION TEST

## 2011-11-27 NOTE — Patient Instructions (Signed)
Your breathing testing shows severe emphysema We will continue the Spiriva every day You need to wear oxygen with all exertion. We will work on getting you a smaller more portable tank.  Follow with Dr Delton Coombes in 4 months or sooner if you have any problems.

## 2011-11-27 NOTE — Assessment & Plan Note (Signed)
She is in some degree of denial about the severity of her COPD. Not interested in adding ICS/LABA. She is taking the Spiriva. Not smoking. She doesn't wear O2 reliably but she doesn't have portable tanks.   Dr Elsworth Soho

## 2011-11-27 NOTE — Progress Notes (Signed)
  Subjective:    Patient ID: Angel Hinton, female    DOB: 1941/03/31, 71 y.o.   MRN: [id number]  HPI 68 female former smoker (09/14/2011)  seen for initial pulmonary\critical care consult during hospitalization. 09/18/2011 for acute on chronic hypoxic respiratory failure secondary to severe emphysema, pneumonia, pulmonary hypertension, and decompensated congestive heart failure.  Post Hospital follow up  10/26/11 -- Patient returns for a post hospital followup. Patient was admitted January 28 through February 5 , for severe shortness of breath. Patient was found to have acute congestive heart failure. She responded well to diuresis. A 2-D echo showed an EF of 65-70%, no regional wall motion abnormalities. Noted to have grade 1 diastolic dysfunction. She was noted to have elevated pulmonary artery pressures and suspected to have secondary pulmonary hypertension. Due to her severe COPD. She underwent a CT chest angiogram that showed no evidence of pulmonary emboli. There was a noted   Right lower lobe consolidation consistent with pneumonia, and she was treated with antibiotics, with good response. He was counseled on smoking cessation. She was started on Spiriva at discharge and recommended to have continuous oxygen therapy at home.  Since discharge. Patient reports she is feeling improved with decreased shortness of breath and cough. She denies any increased swelling. No fever or orthopnea.  She is not wearing her O2 as recommended. Sats on arrival today 87% on room air. We discussed O2 compliance.   ROV 11/27/11 -- Severe COPD, hx diastolic dysfxn. Admitted w R CAP as above.  On Spiriva. Only wearing her O2 at night. PFT today with very severe AFL (FEV1 0.84L). Very active even off her O2. She doesn't have easily portable O2. She is reluctant to wear the O2.         Objective:   Physical Exam GEN: A/Ox3; pleasant , NAD, thin elderly female-frail   HEENT:  [place]/AT,  EACs-clear, TMs-wnl, NOSE-clear,  THROAT-clear, no lesions, no postnasal drip or exudate noted.   NECK:  Supple w/ fair ROM; no JVD; normal carotid impulses w/o bruits; no thyromegaly or nodules palpated; no lymphadenopathy.  RESP  Coarse BS , diminished in bases , no accessory muscle use, no dullness to percussion  CARD:  RRR, no m/r/g  , no peripheral edema, pulses intact, no cyanosis or clubbing.  Musco: Warm bil, no deformities or joint swelling noted.   Neuro: alert, no focal deficits noted.    Skin: Warm, no lesions or rashes       Assessment & Plan:  Smoker Quit 09/14/11  COPD (chronic obstructive pulmonary disease) She is in some degree of denial about the severity of her COPD. Not interested in adding ICS/LABA. She is taking the Spiriva. Not smoking. She doesn't wear O2 reliably but she doesn't have portable tanks.   Dr Elsworth Soho

## 2011-11-27 NOTE — Assessment & Plan Note (Signed)
Quit 09/14/11

## 2011-11-27 NOTE — Progress Notes (Signed)
PFT Done Today by Reynaldo Minium. Carron Curie was training. Carron Curie, CMA

## 2011-12-01 ENCOUNTER — Encounter: Payer: Self-pay | Admitting: Emergency Medicine

## 2012-02-01 ENCOUNTER — Other Ambulatory Visit: Payer: Self-pay | Admitting: Internal Medicine

## 2012-02-17 DIAGNOSIS — H25099 Other age-related incipient cataract, unspecified eye: Secondary | ICD-10-CM | POA: Diagnosis not present

## 2012-03-09 ENCOUNTER — Telehealth: Payer: Self-pay | Admitting: Emergency Medicine

## 2012-03-09 NOTE — Telephone Encounter (Signed)
Pt feels like Spiriva is causing achiness in her joints, numbness if hands and dry mouth.  Pt reports having these symptoms for past 2 weeks.   Has been on Spiriva for 6-7 months now.  Pt would like to stop Spiriva until her appt with Dr Delton Coombes on 03-31-12 and see if symptoms resolve .  Please advise if ok.  Pt aware that Dr Delton Coombes is not back in office until 03-10-12.

## 2012-03-09 NOTE — Telephone Encounter (Signed)
This is ok - she needs to call if there are problems, changes in breathing, etc

## 2012-03-09 NOTE — Telephone Encounter (Signed)
Spoke with patient in regards to stopping Spiriva and RB recs; okay to stop Spiriva until OV 03/31/12 with RB, pt to call us if any breathing problems-pt aware of these recs. Pt also aware that RB out of office until 03/10/12. Pt is not wanting to start back Sprivia due to side effect- feels like it is causing achiness in her joints, numbness if hands and dry mouth. Pt expressed understanding with recs.  Seward Meth, CMA

## 2012-03-31 ENCOUNTER — Encounter: Payer: Self-pay | Admitting: Emergency Medicine

## 2012-03-31 ENCOUNTER — Ambulatory Visit (INDEPENDENT_AMBULATORY_CARE_PROVIDER_SITE_OTHER): Payer: Medicare Other | Admitting: Emergency Medicine

## 2012-03-31 VITALS — BP 112/70 | HR 72 | Temp 98.2°F | Ht 67.0 in | Wt 98.4 lb

## 2012-03-31 DIAGNOSIS — J449 Chronic obstructive pulmonary disease, unspecified: Secondary | ICD-10-CM

## 2012-03-31 NOTE — Assessment & Plan Note (Addendum)
Severe disease, with symptoms, but she doesn';t want to be treated - doesn';t want BD's, doesn't want her O2 with exertion. I have explained that some her sx (finger numbness, etc) may be due to hypoxemia. She is convinced that she does not want to treat.

## 2012-03-31 NOTE — Progress Notes (Signed)
  Subjective:    Patient ID: Angel Hinton, female    DOB: 19-Aug-1940, 71 y.o.   MRN: [id number] HPI 89 female former smoker (09/14/2011)  seen for initial pulmonary\critical care consult during hospitalization. 09/18/2011 for acute on chronic hypoxic respiratory failure secondary to severe emphysema, pneumonia, pulmonary hypertension, and decompensated congestive heart failure.  Post Hospital follow up  10/26/11 -- Patient returns for a post hospital followup. Patient was admitted January 28 through February 5 , for severe shortness of breath. Patient was found to have acute congestive heart failure. She responded well to diuresis. A 2-D echo showed an EF of 65-70%, no regional wall motion abnormalities. Noted to have grade 1 diastolic dysfunction. She was noted to have elevated pulmonary artery pressures and suspected to have secondary pulmonary hypertension. Due to her severe COPD. She underwent a CT chest angiogram that showed no evidence of pulmonary emboli. There was a noted   Right lower lobe consolidation consistent with pneumonia, and she was treated with antibiotics, with good response. He was counseled on smoking cessation. She was started on Spiriva at discharge and recommended to have continuous oxygen therapy at home.  Since discharge. Patient reports she is feeling improved with decreased shortness of breath and cough. She denies any increased swelling. No fever or orthopnea.  She is not wearing her O2 as recommended. Sats on arrival today 87% on room air. We discussed O2 compliance.   ROV 11/27/11 -- Severe COPD, hx diastolic dysfxn. Admitted w R CAP as above.  On Spiriva. Only wearing her O2 at night. PFT today with very severe AFL (FEV1 0.84L). Very active even off her O2. She doesn't have easily portable O2. She is reluctant to wear the O2.   ROV 03/31/12 -- Severe COPD, hx diastolic dysfxn. Had been on Spiriva for 6 months, she asked to stop it in July because she was having severe  polyarthralgia. After stopping the spiriva, she didn;t reallly feel that she lost any ground with her breathing. No flares since last visit. She states that she still has SOB. She has never used her albuterol nebulizer. She doesn't use her O2. She wants me to d/c the O2 with exertion and to keep the nocturnal O2 to sleep with. She doesn't want me to start an new BD.       Objective:   Physical Exam Filed Vitals:   03/31/12 1413  BP: 112/70  Pulse: 72  Temp: 98.2 F (36.8 C)   GEN: A/Ox3; pleasant , NAD, thin elderly female-frail   HEENT:  [place]/AT,  EACs-clear, TMs-wnl, NOSE-clear, THROAT-clear, no lesions, no postnasal drip or exudate noted.   NECK:  Supple w/ fair ROM; no JVD; normal carotid impulses w/o bruits; no thyromegaly or nodules palpated; no lymphadenopathy.  RESP  Coarse BS , diminished in bases , no accessory muscle use, no dullness to percussion  CARD:  RRR, no m/r/g  , no peripheral edema, pulses intact, no cyanosis or clubbing.  Musco: Warm bil, no deformities or joint swelling noted.   Neuro: alert, no focal deficits noted.    Skin: Warm, no lesions or rashes      Assessment & Plan:  COPD (chronic obstructive pulmonary disease) Severe disease, with symptoms, but she doesn';t want to be treated - doesn';t want BD's, doesn't want her O2 with exertion. I have explained that some her sx (finger numbness, etc) may be due to hypoxemia. She is convinced that she does not want to treat.

## 2012-03-31 NOTE — Patient Instructions (Addendum)
We will not start any inhaled medications at this time. You can still use your albuterol nebulizer as needed for shortness of breath We will stop your oxygen with exertion and continue your oxygen while sleeping Follow with Dr Delton Coombes in 1 year or sooner if you have any problems.

## 2012-06-04 ENCOUNTER — Encounter (HOSPITAL_COMMUNITY): Payer: Self-pay | Admitting: Emergency Medicine

## 2012-06-04 ENCOUNTER — Emergency Department (HOSPITAL_COMMUNITY)
Admission: EM | Admit: 2012-06-04 | Discharge: 2012-06-04 | Disposition: A | Discharge status: Home / Self Care | Payer: Medicare Other | Attending: Emergency Medicine | Admitting: Emergency Medicine

## 2012-06-04 DIAGNOSIS — W268XXA Contact with other sharp object(s), not elsewhere classified, initial encounter: Secondary | ICD-10-CM | POA: Insufficient documentation

## 2012-06-04 DIAGNOSIS — S61209A Unspecified open wound of unspecified finger without damage to nail, initial encounter: Secondary | ICD-10-CM | POA: Insufficient documentation

## 2012-06-04 DIAGNOSIS — Z23 Encounter for immunization: Secondary | ICD-10-CM | POA: Diagnosis not present

## 2012-06-04 DIAGNOSIS — S61219A Laceration without foreign body of unspecified finger without damage to nail, initial encounter: Secondary | ICD-10-CM

## 2012-06-04 HISTORY — DX: Heart failure, unspecified: I50.9

## 2012-06-04 HISTORY — DX: Chronic obstructive pulmonary disease, unspecified: J44.9

## 2012-06-04 MED ORDER — TETANUS-DIPHTH-ACELL PERTUSSIS 5-2.5-18.5 LF-MCG/0.5 IM SUSP
0.5000 mL | Freq: Once | INTRAMUSCULAR | Status: AC
  Administered 2012-06-04: 0.5 mL via INTRAMUSCULAR
  Filled 2012-06-04: qty 0.5

## 2012-06-04 MED ORDER — ACETAMINOPHEN 325 MG PO TABS
650.0000 mg | ORAL_TABLET | Freq: Once | ORAL | Status: AC
  Administered 2012-06-04: 650 mg via ORAL
  Filled 2012-06-04: qty 2

## 2012-06-04 NOTE — ED Provider Notes (Signed)
Medical screening examination/treatment/procedure(s) were performed by non-physician practitioner and as supervising physician I was immediately available for consultation/collaboration.  Cheri Guppy, MD 06/04/12 2106

## 2012-06-04 NOTE — ED Provider Notes (Signed)
History     CSN: [id number]  Arrival date & time 06/04/12  1647   First MD Initiated Contact with Patient 06/04/12 1757      Chief Complaint  Patient presents with  . Extremity Laceration    (Consider location/radiation/quality/duration/timing/severity/associated sxs/prior treatment) HPI  She presents to the emergency department complaints of left finger laceration. She's cutting paper with scissors and accidentally cut her finger. Being is controlled at this time. Upon arrival to triage her oxygen was noted to be 88. After being rechecked in examination room her oxygen is 92. The patient does use 2 L nasal cannula at home for oxygen and says that she feels fine with no chest pain, shortness of breath, weakness or lightheadedness. Her blood pressure is 161/75 the patient has no other symptoms at this time aside from her left finger pain.  Past Medical History  Diagnosis Date  . COPD (chronic obstructive pulmonary disease)   . CHF (congestive heart failure)     No past surgical history on file.  Family History  Problem Relation Age of Onset  . Hypertension Father   . Hypertension Sister   . Diabetes Sister     History  Substance Use Topics  . Smoking status: Former Smoker -- 1.0 packs/day for 49 years    Types: Cigarettes    Quit date: 09/14/2011  . Smokeless tobacco: Never Used  . Alcohol Use: No    OB History    Grav Para Term Preterm Abortions TAB SAB Ect Mult Living                  Review of Systems  Review of Systems  Gen: no weight loss, fevers, chills, night sweats  Eyes: no discharge or drainage, no occular pain or visual changes  Nose: no epistaxis or rhinorrhea  Mouth: no dental pain, no sore throat  Neck: no neck pain  Lungs:No wheezing, coughing or hemoptysis CV: no chest pain, palpitations, dependent edema or orthopnea  Abd: no abdominal pain, nausea, vomiting  GU: no dysuria or gross hematuria  MSK:  Left finger lac Neuro: no headache, no  focal neurologic deficits  Skin: no abnormalities Psyche: negative.   Allergies  Erythromycin; Amoxicillin; and Amoxicillin-pot clavulanate  Home Medications   Current Outpatient Rx  Name Route Sig Dispense Refill  . ACETAMINOPHEN 500 MG PO TABS Oral Take 1,000 mg by mouth every 6 (six) hours as needed. pain    . FUROSEMIDE 40 MG PO TABS Oral Take 40 mg by mouth daily as needed.    . ALBUTEROL SULFATE (5 MG/ML) 0.5% IN NEBU Nebulization Take 0.5 mLs (2.5 mg total) by nebulization every 6 (six) hours as needed for wheezing or shortness of breath. 20 mL 3  . GUAIFENESIN ER 600 MG PO TB12 Oral Take 1,200 mg by mouth daily as needed.    Marland Kitchen TIOTROPIUM BROMIDE MONOHYDRATE 18 MCG IN CAPS Inhalation Place 1 capsule (18 mcg total) into inhaler and inhale daily. 30 capsule 3    BP 161/75  Pulse 70  Temp 97.7 F (36.5 C) (Oral)  Resp 18  SpO2 92%  Physical Exam  Nursing note and vitals reviewed. Constitutional: She appears well-developed and well-nourished. No distress.  HENT:  Head: Normocephalic and atraumatic.  Eyes: Pupils are equal, round, and reactive to light.  Neck: Normal range of motion. Neck supple.  Cardiovascular: Normal rate and regular rhythm.   Pulmonary/Chest: Effort normal.  Abdominal: Soft.  Musculoskeletal:       Hands:  Pt has small amount of tissue missing from tip of right ring finger with no flap to approximate. Wound is not deep, only subcutaneous layer missing. Finger pad still intact, no nail bed involvement. FROM, cap refill < 3 seconds  Neurological: She is alert.  Skin: Skin is warm and dry.    ED Course  Procedures (including critical care time)  Labs Reviewed - No data to display No results found.   1. Finger laceration       MDM  LACERATION REPAIR Performed by: Dorthula Matas Authorized by: Dorthula Matas Consent: Verbal consent obtained. Risks and benefits: risks, benefits and alternatives were discussed Consent given by:  patient Patient identity confirmed: provided demographic data Prepped and Draped in normal sterile fashion Wound explored  Laceration Location: right ring finger  Laceration Length: superficial tip  No Foreign Bodies seen or palpated  Skin closure: wound seal  Technique: wound seal  Patient tolerance: Patient tolerated the procedure well with no immediate complications.  Pt given tylenol for pain. She has been told to see her PCP on Tuesday or Wednesday for him to let her know when she can get it wet again. The patient lives at home but seems a bit confused on how to care for wound. Wound is superficial with no muscle, tendon, finger pad, finger nail, or bone involvement therefore I feel she will be okay to be treated at home. Tetanus shot given in ED.  Pt has been advised of the symptoms that warrant their return to the ED. Patient has voiced understanding and has agreed to follow-up with the PCP or specialist.        Dorthula Matas, PA 06/04/12 1930

## 2012-06-04 NOTE — ED Notes (Signed)
Pt states that she was using a new pair of scissors and "cut the tip of her finger off".  Right ring finger.  Bleeding controlled.  O2 sats 88% BP 196/69.  BP done twice.

## 2012-06-09 DIAGNOSIS — M79609 Pain in unspecified limb: Secondary | ICD-10-CM | POA: Diagnosis not present

## 2012-06-09 DIAGNOSIS — T148XXA Other injury of unspecified body region, initial encounter: Secondary | ICD-10-CM | POA: Diagnosis not present

## 2012-06-09 DIAGNOSIS — J449 Chronic obstructive pulmonary disease, unspecified: Secondary | ICD-10-CM | POA: Diagnosis not present

## 2012-08-23 ENCOUNTER — Ambulatory Visit: Payer: Medicare Other | Admitting: Emergency Medicine

## 2012-09-05 ENCOUNTER — Ambulatory Visit (INDEPENDENT_AMBULATORY_CARE_PROVIDER_SITE_OTHER): Payer: Medicare Other | Admitting: Emergency Medicine

## 2012-09-05 ENCOUNTER — Encounter: Payer: Self-pay | Admitting: Emergency Medicine

## 2012-09-05 VITALS — BP 122/64 | HR 73 | Temp 98.0°F | Ht 67.0 in | Wt 101.6 lb

## 2012-09-05 DIAGNOSIS — J449 Chronic obstructive pulmonary disease, unspecified: Secondary | ICD-10-CM | POA: Diagnosis not present

## 2012-09-05 NOTE — Patient Instructions (Addendum)
Please continue to have your albuterol available to use if needed for shortness of breath You would probably benefit from wearing your oxygen with exertion.  Follow with Dr Delton Coombes in 12 months or sooner if you have any problems

## 2012-09-05 NOTE — Progress Notes (Signed)
Subjective:    Patient ID: Angel Hinton, female    DOB: 02-Sep-1940, 72 y.o.   MRN: [id number] HPI 48 female former smoker (09/14/2011)  seen for initial pulmonary\critical care consult during hospitalization. 09/18/2011 for acute on chronic hypoxic respiratory failure secondary to severe emphysema, pneumonia, pulmonary hypertension, and decompensated congestive heart failure.  Post Hospital follow up  10/26/11 -- Patient returns for a post hospital followup. Patient was admitted January 28 through February 5 , for severe shortness of breath. Patient was found to have acute congestive heart failure. She responded well to diuresis. A 2-D echo showed an EF of 65-70%, no regional wall motion abnormalities. Noted to have grade 1 diastolic dysfunction. She was noted to have elevated pulmonary artery pressures and suspected to have secondary pulmonary hypertension. Due to her severe COPD. She underwent a CT chest angiogram that showed no evidence of pulmonary emboli. There was a noted   Right lower lobe consolidation consistent with pneumonia, and she was treated with antibiotics, with good response. He was counseled on smoking cessation. She was started on Spiriva at discharge and recommended to have continuous oxygen therapy at home.  Since discharge. Patient reports she is feeling improved with decreased shortness of breath and cough. She denies any increased swelling. No fever or orthopnea.  She is not wearing her O2 as recommended. Sats on arrival today 87% on room air. We discussed O2 compliance.   ROV 11/27/11 -- Severe COPD, hx diastolic dysfxn. Admitted w R CAP as above.  On Spiriva. Only wearing her O2 at night. PFT today with very severe AFL (FEV1 0.84L). Very active even off her O2. She doesn't have easily portable O2. She is reluctant to wear the O2.   ROV 03/31/12 -- Severe COPD, hx diastolic dysfxn. Had been on Spiriva for 6 months, she asked to stop it in July because she was having severe  polyarthralgia. After stopping the spiriva, she didn;t reallly feel that she lost any ground with her breathing. No flares since last visit. She states that she still has SOB. She has never used her albuterol nebulizer. She doesn't use her O2. She wants me to d/c the O2 with exertion and to keep the nocturnal O2 to sleep with. She doesn't want me to start an new BD.   ROV 09/05/12 -- Severe COPD, hx diastolic dysfxn, hypoxemia. We had stopped spiriva before because of myalgias. She has albuterol to use as needed but never uses it. She has occasional SOB with exertion. She isn't as functional as she was 1-13yrs ago. She doesn't use her O2 w exertion, only at night. She is unwilling to wear o2 w exertion.       Objective:   Physical Exam Filed Vitals:   09/05/12 1618  BP: 122/64  Pulse: 73  Temp: 98 F (36.7 C)   GEN: A/Ox3; pleasant , NAD, thin elderly female-frail   HEENT:  [place]/AT,  EACs-clear, TMs-wnl, NOSE-clear, THROAT-clear, no lesions, no postnasal drip or exudate noted.   NECK:  Supple w/ fair ROM; no JVD; normal carotid impulses w/o bruits; no thyromegaly or nodules palpated; no lymphadenopathy.  RESP  Coarse BS , diminished in bases , no accessory muscle use, no dullness to percussion  CARD:  RRR, no m/r/g  , no peripheral edema, pulses intact, no cyanosis or clubbing.  Musco: Warm bil, no deformities or joint swelling noted.   Neuro: alert, no focal deficits noted.    Skin: Warm, no lesions or rashes  Assessment & Plan:  COPD (chronic obstructive pulmonary disease) Her disease is severe, but she does not want to use O2 or to start scheduled BD's. I have offered both. She has albuterol that she can use as needed.

## 2012-09-05 NOTE — Assessment & Plan Note (Signed)
Her disease is severe, but she does not want to use O2 or to start scheduled BD's. I have offered both. She has albuterol that she can use as needed.

## 2014-01-09 IMAGING — CT CT ANGIO CHEST
2 of 6 series · 19 of 36 positions shown · IV contrast (APPLIED)
Comparison: 09/16/2011 noncontrast CT chest

CLINICAL DATA: Lower extremity edema, shortness of breath, question
pulmonary embolism or chronic thromboembolic disease

CT ANGIOGRAPHY CHEST
TECHNIQUE: Multidetector CT imaging of the chest using the
standard protocol during bolus administration of intravenous
contrast. Multiplanar reconstructed images including MIPs were
obtained and reviewed to evaluate the vascular anatomy.
Contrast: 100mL OMNIPAQUE IOHEXOL 300 MG/ML IV SOLN

[Series 6: pe thins @ 1mm · axial · 0.67mm/px · z∈[-260,+40]mm · 18 of 335 slices shown]
[im 17/335  lung]
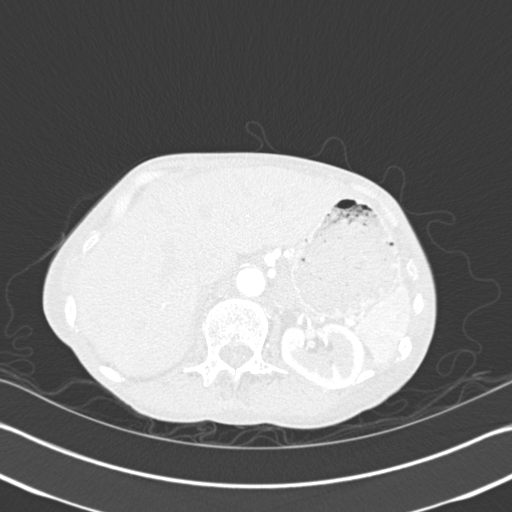
[im 34/335  mediastinal]
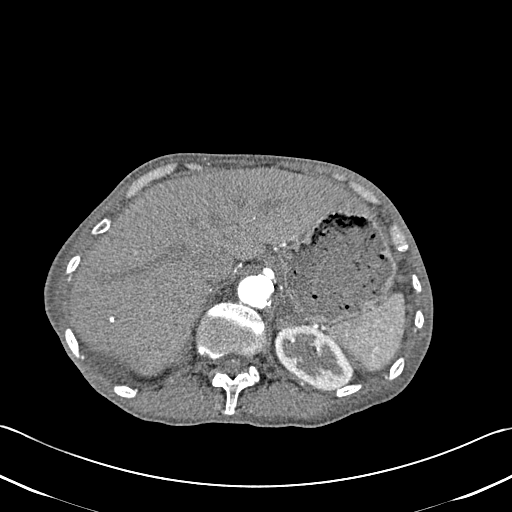
[im 51/335  lung]
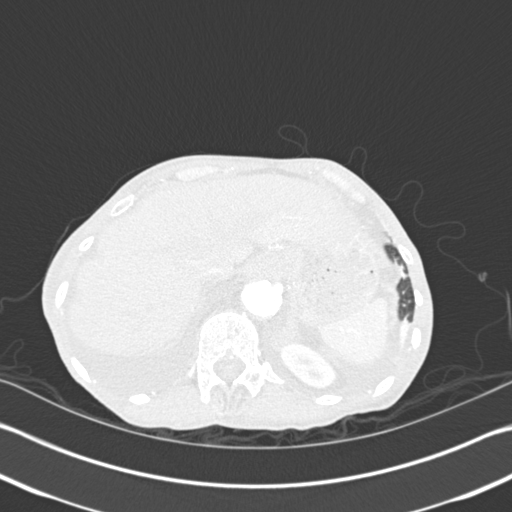
[im 67/335  mediastinal]
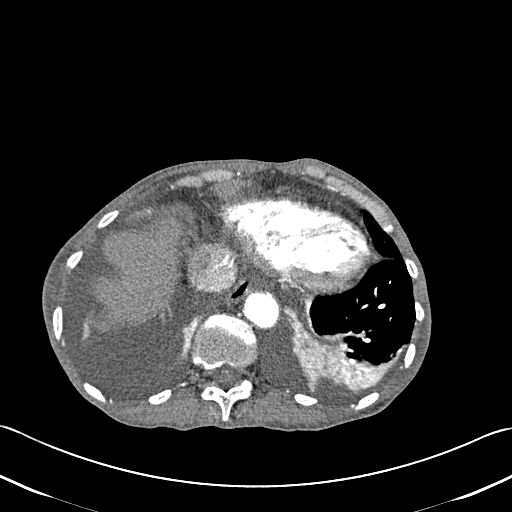
[im 84/335  lung]
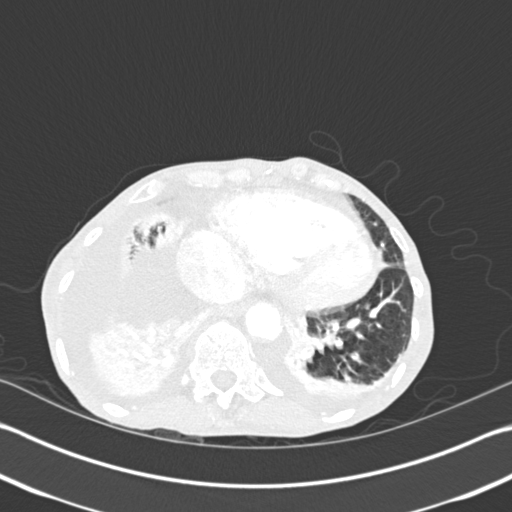
[im 101/335  mediastinal]
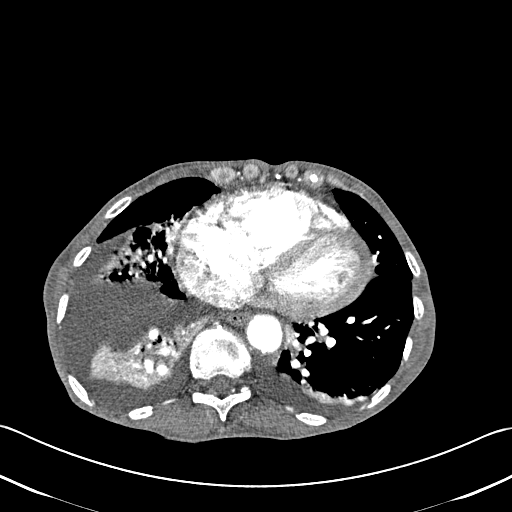
[im 117/335  lung]
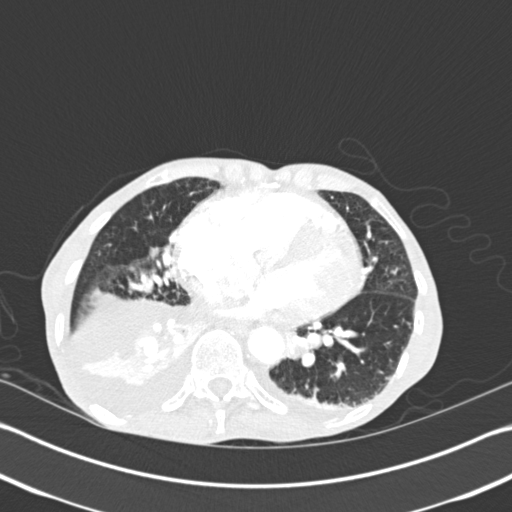
[im 134/335  mediastinal]
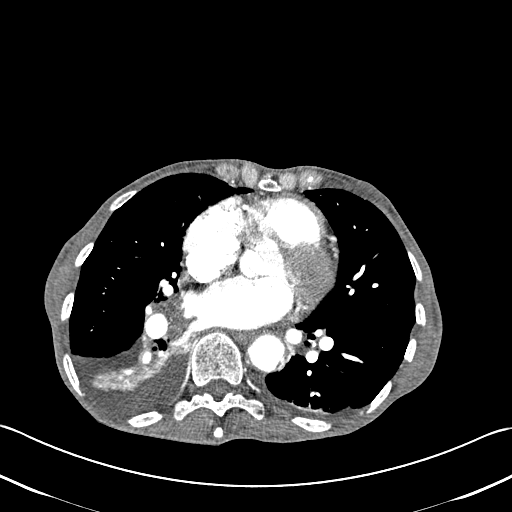
[im 151/335  lung]
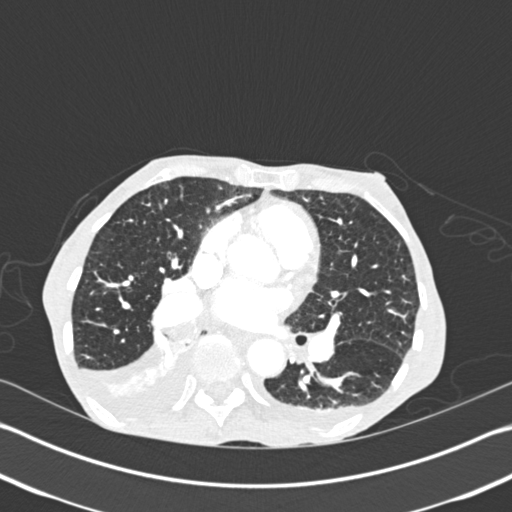
[im 184/335  mediastinal]
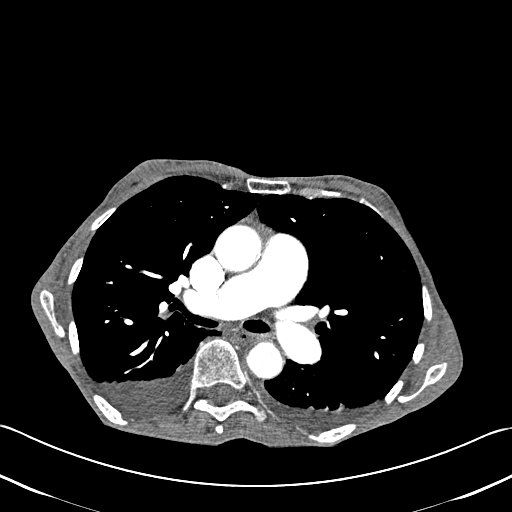
[im 201/335  lung]
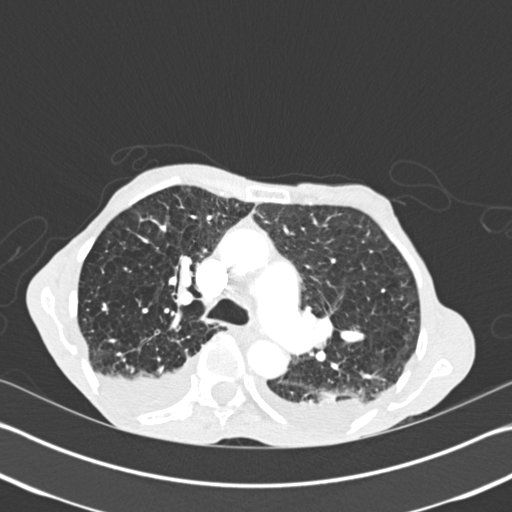
[im 218/335  mediastinal]
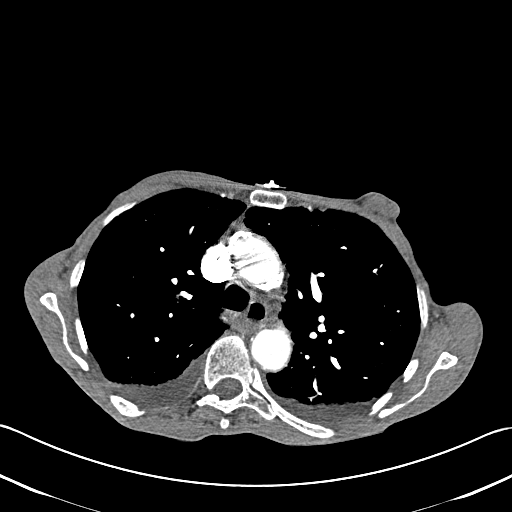
[im 234/335  lung]
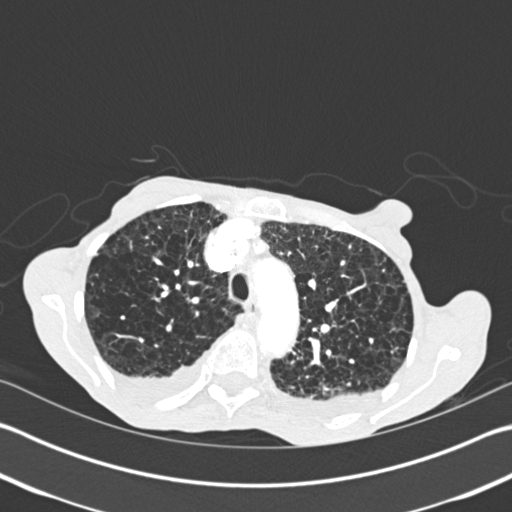
[im 251/335  mediastinal]
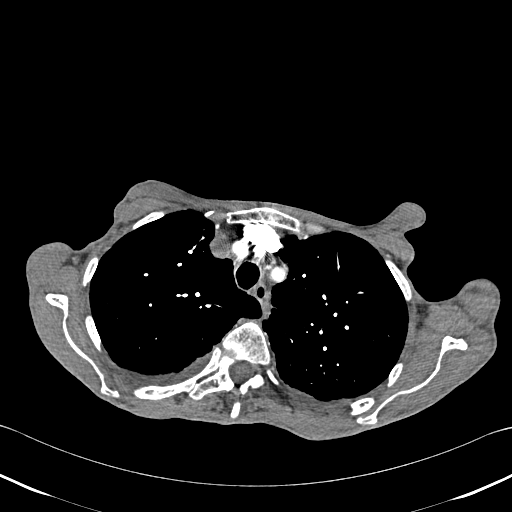
[im 268/335  lung]
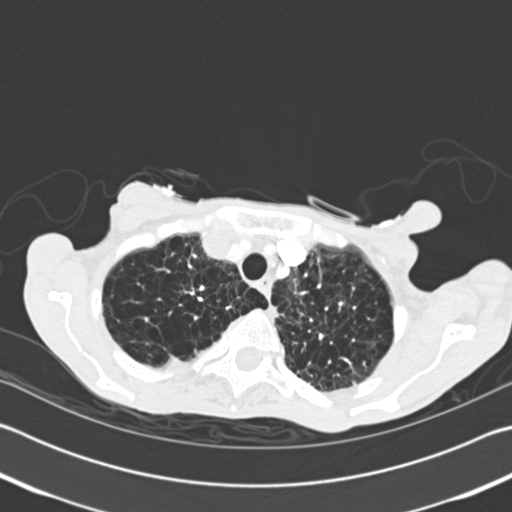
[im 284/335  mediastinal]
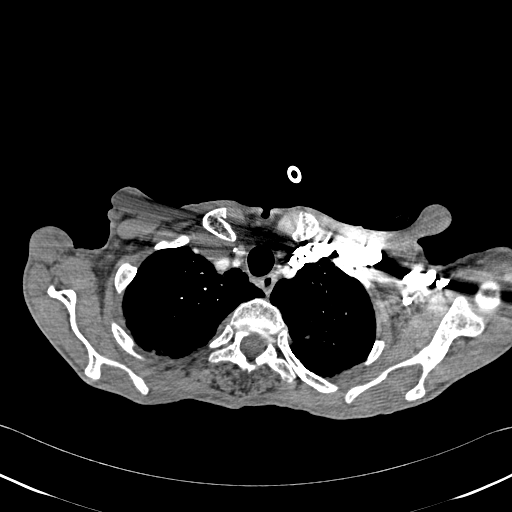
[im 301/335  lung]
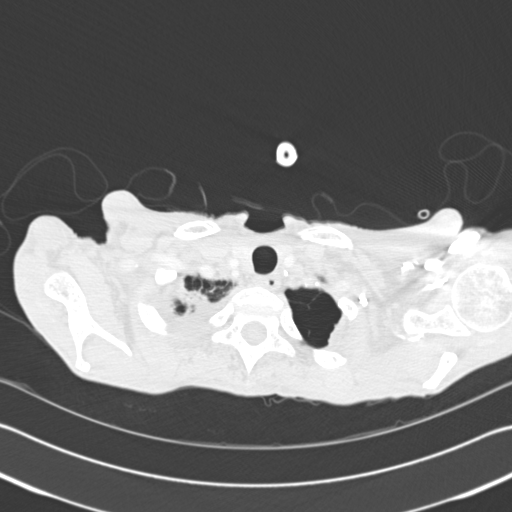
[im 318/335  mediastinal]
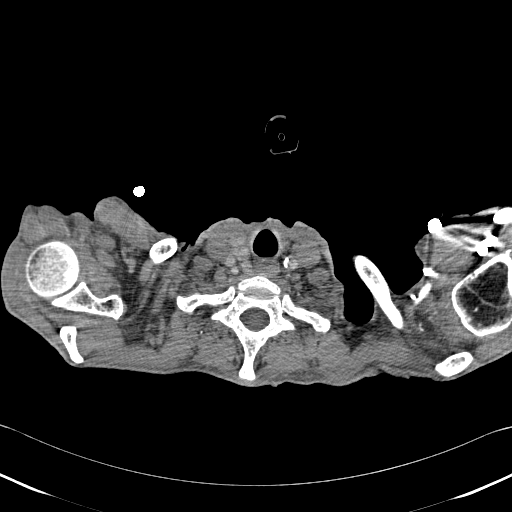

[Series 602: <mpr thick range> · coronal · 0.67mm/px · 1 of 103 slices shown]
[im 52/103  mediastinal]
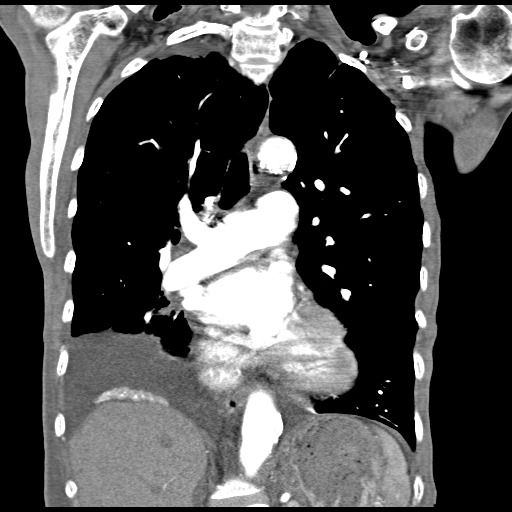

[19 of 36 positions shown; findings below may reference images not displayed]

FINDINGS: Scattered atherosclerotic calcifications aorta without aneurysm or
dissection.
No thoracic adenopathy.
Pulmonary arteries patent.
No evidence of pulmonary embolism.
No definite areas of wall thickening, synechia, or calcification
identified at the pulmonary arteries to suggest sequelae of chronic
thromboembolic disease.
Bilateral pleural effusions.
Mildly prominent central pulmonary arteries.
Bilateral lower lobe atelectasis.
Atelectasis at base of right middle lobe as well.
Severe emphysematous changes without infiltrate or pneumothorax.
Fluid or mucus within lower lobe bronchi bilaterally.
Mild thoracic scoliosis.
IMPRESSION: No evidence of pulmonary embolism.
Scattered atherosclerotic calcifications aorta.
Moderate sized bilateral pleural effusions with bilateral lower
lobe and basilar right middle lobe atelectasis.
Emphysematous changes with question fluid or mucus within lower
lobe bronchi bilaterally.

## 2014-05-04 ENCOUNTER — Encounter: Payer: Self-pay | Admitting: *Deleted

## 2014-05-14 DIAGNOSIS — Z0000 Encounter for general adult medical examination without abnormal findings: Secondary | ICD-10-CM | POA: Diagnosis not present

## 2014-05-14 DIAGNOSIS — Z23 Encounter for immunization: Secondary | ICD-10-CM | POA: Diagnosis not present

## 2014-05-14 DIAGNOSIS — J449 Chronic obstructive pulmonary disease, unspecified: Secondary | ICD-10-CM | POA: Diagnosis not present

## 2014-05-14 DIAGNOSIS — R300 Dysuria: Secondary | ICD-10-CM | POA: Diagnosis not present

## 2014-05-14 DIAGNOSIS — Z136 Encounter for screening for cardiovascular disorders: Secondary | ICD-10-CM | POA: Diagnosis not present

## 2014-05-14 DIAGNOSIS — Z131 Encounter for screening for diabetes mellitus: Secondary | ICD-10-CM | POA: Diagnosis not present

## 2014-09-13 ENCOUNTER — Other Ambulatory Visit: Payer: Self-pay | Admitting: Family Medicine

## 2014-09-13 ENCOUNTER — Ambulatory Visit
Admission: RE | Admit: 2014-09-13 | Discharge: 2014-09-13 | Disposition: A | Discharge status: Home / Self Care | Payer: Medicare Other | Source: Ambulatory Visit | Attending: Family Medicine | Admitting: Family Medicine

## 2014-09-13 DIAGNOSIS — R05 Cough: Secondary | ICD-10-CM

## 2014-09-13 DIAGNOSIS — R0989 Other specified symptoms and signs involving the circulatory and respiratory systems: Secondary | ICD-10-CM | POA: Diagnosis not present

## 2014-09-13 DIAGNOSIS — R030 Elevated blood-pressure reading, without diagnosis of hypertension: Secondary | ICD-10-CM | POA: Diagnosis not present

## 2014-09-13 DIAGNOSIS — Z87891 Personal history of nicotine dependence: Secondary | ICD-10-CM | POA: Diagnosis not present

## 2014-09-13 DIAGNOSIS — R918 Other nonspecific abnormal finding of lung field: Secondary | ICD-10-CM | POA: Diagnosis not present

## 2014-09-13 DIAGNOSIS — R0602 Shortness of breath: Secondary | ICD-10-CM | POA: Diagnosis not present

## 2014-09-13 DIAGNOSIS — R059 Cough, unspecified: Secondary | ICD-10-CM

## 2014-09-13 DIAGNOSIS — N3090 Cystitis, unspecified without hematuria: Secondary | ICD-10-CM | POA: Diagnosis not present

## 2014-09-14 ENCOUNTER — Other Ambulatory Visit: Payer: Self-pay | Admitting: Family Medicine

## 2014-09-14 DIAGNOSIS — R9389 Abnormal findings on diagnostic imaging of other specified body structures: Secondary | ICD-10-CM

## 2014-09-14 DIAGNOSIS — R0989 Other specified symptoms and signs involving the circulatory and respiratory systems: Secondary | ICD-10-CM

## 2014-09-19 ENCOUNTER — Other Ambulatory Visit: Payer: Medicare Other

## 2014-09-21 ENCOUNTER — Ambulatory Visit
Admission: RE | Admit: 2014-09-21 | Discharge: 2014-09-21 | Disposition: A | Discharge status: Home / Self Care | Payer: Medicare Other | Source: Ambulatory Visit | Attending: Family Medicine | Admitting: Family Medicine

## 2014-09-21 DIAGNOSIS — R030 Elevated blood-pressure reading, without diagnosis of hypertension: Secondary | ICD-10-CM | POA: Diagnosis not present

## 2014-09-21 DIAGNOSIS — R0989 Other specified symptoms and signs involving the circulatory and respiratory systems: Secondary | ICD-10-CM

## 2014-09-21 DIAGNOSIS — J984 Other disorders of lung: Secondary | ICD-10-CM | POA: Diagnosis not present

## 2014-09-21 DIAGNOSIS — J9819 Other pulmonary collapse: Secondary | ICD-10-CM | POA: Diagnosis not present

## 2014-09-21 DIAGNOSIS — N3090 Cystitis, unspecified without hematuria: Secondary | ICD-10-CM | POA: Diagnosis not present

## 2014-09-21 DIAGNOSIS — I6523 Occlusion and stenosis of bilateral carotid arteries: Secondary | ICD-10-CM | POA: Diagnosis not present

## 2014-09-21 DIAGNOSIS — R9389 Abnormal findings on diagnostic imaging of other specified body structures: Secondary | ICD-10-CM

## 2014-09-21 DIAGNOSIS — R918 Other nonspecific abnormal finding of lung field: Secondary | ICD-10-CM | POA: Diagnosis not present

## 2014-09-21 DIAGNOSIS — J439 Emphysema, unspecified: Secondary | ICD-10-CM | POA: Diagnosis not present

## 2014-09-21 MED ORDER — IOHEXOL 300 MG/ML  SOLN
100.0000 mL | Freq: Once | INTRAMUSCULAR | Status: AC | PRN
  Administered 2014-09-21: 75 mL via INTRAVENOUS

## 2015-02-01 ENCOUNTER — Other Ambulatory Visit: Payer: Self-pay | Admitting: Family Medicine

## 2015-02-01 DIAGNOSIS — J849 Interstitial pulmonary disease, unspecified: Secondary | ICD-10-CM

## 2015-02-05 ENCOUNTER — Other Ambulatory Visit: Payer: Self-pay | Admitting: Family Medicine

## 2015-02-05 ENCOUNTER — Ambulatory Visit
Admission: RE | Admit: 2015-02-05 | Discharge: 2015-02-05 | Disposition: A | Discharge status: Home / Self Care | Payer: Medicare Other | Source: Ambulatory Visit | Attending: Family Medicine | Admitting: Family Medicine

## 2015-02-05 DIAGNOSIS — R634 Abnormal weight loss: Secondary | ICD-10-CM | POA: Diagnosis not present

## 2015-02-05 DIAGNOSIS — Z87891 Personal history of nicotine dependence: Secondary | ICD-10-CM | POA: Diagnosis not present

## 2015-02-05 DIAGNOSIS — J432 Centrilobular emphysema: Secondary | ICD-10-CM | POA: Diagnosis not present

## 2015-02-05 DIAGNOSIS — J9811 Atelectasis: Secondary | ICD-10-CM | POA: Diagnosis not present

## 2015-02-05 DIAGNOSIS — J849 Interstitial pulmonary disease, unspecified: Secondary | ICD-10-CM

## 2015-02-13 ENCOUNTER — Telehealth: Payer: Self-pay | Admitting: Emergency Medicine

## 2015-02-13 NOTE — Telephone Encounter (Signed)
I reviewed pt's CT chest. I agree that there is an irregularity to her RML and bronchus intermedius airways. The best way to assess this would be by FOB. She needs to be seen asap to determine whether she can tolerate bronch, then have an standard bronch to assess airways. I am not in town next week, so will have to see if someone else can perform next week. I will ask VS, RA. Attempt to organize.

## 2015-02-13 NOTE — Telephone Encounter (Signed)
Called [facility] and LMTCB x1  Called pt to schedule an appt. She is scheduled to see MW 02/19/15 at 3pm. Offered appt this week but stated she is unable to do so d/t transportation.  FYI for RB as well.

## 2015-02-13 NOTE — Telephone Encounter (Signed)
Elmyra Ricks returned call. I informed her that an appt was made and when. Also informed Elmyra Ricks that we offered appt this week and pt refused.  Elmyra Ricks states there was no need to call back unless needed.

## 2015-02-13 NOTE — Telephone Encounter (Signed)
#   listed for Elmyra Ricks is not correct. Called correct # Pt had CT done 02/05/15 (in epic) Pt not scheduled to see RB until 03/26/15. Wants to know if pt can be worked in sooner with [place] and also if [place] can look at her CT scan STAT. Please advise RB thanks

## 2015-02-14 ENCOUNTER — Telehealth: Payer: Self-pay | Admitting: Emergency Medicine

## 2015-02-14 NOTE — Telephone Encounter (Signed)
Spoke with pt. Advised her that RB wanted to see RA instead of MW because he has discussed the pt's case with RA. Also advised her that RB is fine with her keep the appointment she has with MW. Reassured pt that nothing was wrong but RB just wanted her to see RA. Nothing further was needed at this time.

## 2015-02-14 NOTE — Telephone Encounter (Signed)
Spoke with pt. She can't come any earlier than what she is already scheduled for.  Spoke with RB on the phone. He is fine with her keeping this appointment. MW can perform the FOB.

## 2015-02-14 NOTE — Telephone Encounter (Signed)
i spoke to RA about this pt. He needs to see her, work her in next week then do FOB. Please cancel the MW visit and get her in with RA tomorrow or next week

## 2015-02-19 ENCOUNTER — Ambulatory Visit (INDEPENDENT_AMBULATORY_CARE_PROVIDER_SITE_OTHER): Payer: Medicare Other | Admitting: Internal Medicine

## 2015-02-19 ENCOUNTER — Encounter: Payer: Self-pay | Admitting: Internal Medicine

## 2015-02-19 VITALS — BP 140/84 | HR 74 | Ht 66.5 in | Wt 92.6 lb

## 2015-02-19 DIAGNOSIS — R918 Other nonspecific abnormal finding of lung field: Secondary | ICD-10-CM | POA: Diagnosis not present

## 2015-02-19 DIAGNOSIS — J449 Chronic obstructive pulmonary disease, unspecified: Secondary | ICD-10-CM | POA: Diagnosis not present

## 2015-02-19 DIAGNOSIS — J9612 Chronic respiratory failure with hypercapnia: Secondary | ICD-10-CM

## 2015-02-19 DIAGNOSIS — J9610 Chronic respiratory failure, unspecified whether with hypoxia or hypercapnia: Secondary | ICD-10-CM | POA: Insufficient documentation

## 2015-02-19 NOTE — Patient Instructions (Addendum)
Please schedule a follow up office visit in 4 weeks, sooner if needed with Dr Lamonte Sakai with [facility]   Please see patient coordinator before you leave today  to schedule portable 02 system evaluation > if this corrects your breathing difficulties to your satisfaction fine, otherwise we can look again to see what we can do when you return if you keep an open mind about risks and benefits

## 2015-02-19 NOTE — Progress Notes (Signed)
Subjective:    Patient ID: Angel Hinton, female    DOB: [birth year], 74 y.o.   MRN: [id number] HPI [id number] female former smoker (09/14/2011)  seen for initial pulmonary\critical care consult during hospitalization. 09/18/2011 for acute on chronic hypoxic respiratory failure secondary to severe emphysema, pneumonia, pulmonary hypertension, and decompensated congestive heart failure.   ROV 09/05/12 -- Dr Lamonte Sakai: Severe COPD, hx diastolic dysfxn, hypoxemia. We had stopped spiriva before because of myalgias. She has albuterol to use as needed but never uses it. She has occasional SOB with exertion. She isn't as functional as she was 1-88yrs ago. She doesn't use her O2 w exertion, only at night. She is unwilling to wear o2 w exertion.  rec Please continue to have your albuterol available to use if needed for shortness of breath You would probably benefit from wearing your oxygen with exertion.  Follow with Dr Lamonte Sakai in 12 months or sooner if you have any problems   02/19/2015 acute  ov/Angel Hinton re: COPD sp quit smoking 2013  - afraid of using meds p reading the info/ not even trying saba  Chief Complaint  Patient presents with  . Follow-up    Pt here to f/u on recent CT Chest. Pt states her breathing is worse since last visit here in 2014. She gets SOB "of there is an emergency".    not using portable 02/ not sure 02 machine working at home  Pattern has been daily/ progressive doe x more than 50 feet RA doe   No obvious day to day or daytime variabilty or assoc chronic cough or cp or chest tightness, subjective wheeze overt sinus or hb symptoms. No unusual exp hx or h/o childhood pna/ asthma or knowledge of premature birth.  Sleeping ok without nocturnal  or early am exacerbation  of respiratory  c/o's or need for noct saba. Also denies any obvious fluctuation of symptoms with weather or environmental changes or other aggravating or alleviating factors except as outlined above   Current Medications, Allergies,  Complete Past Medical History, Past Surgical History, Family History, and Social History were reviewed in [employer] record.  ROS  The following are not active complaints unless bolded sore throat, dysphagia, dental problems, itching, sneezing,  nasal congestion or excess/ purulent secretions, ear ache,   fever, chills, sweats, unintended wt loss, pleuritic or exertional cp, hemoptysis,  orthopnea pnd or leg swelling, presyncope, palpitations, abdominal pain, anorexia, nausea, vomiting, diarrhea  or change in bowel or urinary habits, change in stools or urine, dysuria,hematuria,  rash, arthralgias, visual complaints, headache, numbness weakness or ataxia or problems with walking or coordination,  change in mood/affect or memory.                Objective:   Physical Exam   Wt Readings from Last 3 Encounters:  02/19/15 92 lb 9.6 oz (42.003 kg)  09/05/12 101 lb 9.6 oz (46.085 kg)  03/31/12 98 lb 6.4 oz (44.634 kg)    Vital signs reviewed     GEN: A/Ox3; pleasant , NAD, thin elderly female-frail   HEENT:  [place]/AT,  EACs-clear, TMs-wnl, NOSE-clear, THROAT-clear, no lesions, no postnasal drip or exudate noted.   NECK:  Supple w/ fair ROM; no JVD; normal carotid impulses w/o bruits; no thyromegaly or nodules palpated; no lymphadenopathy.  RESP  Coarse BS , diminished in bases , no accessory muscle use, no dullness to percussion  CARD:  RRR, no m/r/g  , no peripheral edema, pulses intact, no cyanosis or clubbing.  Musco: Warm bil, no deformities or joint swelling noted.   Neuro: alert, no focal deficits noted.    Skin: Warm, no lesions or rashes    I personally reviewed images and agree with radiology impression as follows:   CT chest 02/05/15 1. Persistent partial atelectasis of the right middle lobe although there has been improvement in the right middle lobe pneumonia. Cannot exclude a central endobronchial lesion involving the bronchus to the right  middle lobe. 2. Severe centrilobular emphysema. 3. Prominent pulmonary arteries segments consistent with pulmonary arterial hypertension.    Assessment & Plan:

## 2015-02-20 ENCOUNTER — Encounter: Payer: Self-pay | Admitting: Internal Medicine

## 2015-02-20 DIAGNOSIS — R918 Other nonspecific abnormal finding of lung field: Secondary | ICD-10-CM | POA: Insufficient documentation

## 2015-02-20 NOTE — Assessment & Plan Note (Signed)
Discussed in detail all the  indications, usual  risks and alternatives  relative to the benefits with patient who agrees to conservative f/u for now   Although there are clearly abnormalities on CT scan, they should probably be considered "microscopic" since not obvious on plain cxr .     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

## 2015-02-20 NOTE — Assessment & Plan Note (Signed)
Pfts  11/27/11   FEV1  0.85 (37%) ratio 37 with dlco 27 corrects to 41%  - started amb 02 02/19/15 (see chronic resp failure)  Discussed in detail all the  indications, usual  risks and alternatives  relative to the benefits with patient re medications for copd - luckily she's an emphysemic "phenotype" or she would have been a frequent ER /hosp user which has not proved to be the case  She has refused again today to consider a maint rx/ has saba but won't use it  I had an extended discussion with the patient reviewing all relevant studies completed to date and  lasting 16m  minute visit    Each maintenance medication was reviewed in detail including most importantly the difference between maintenance and prns and under what circumstances the prns are to be triggered using an action plan format that is not reflected in the computer generated alphabetically organized AVS.    Please see instructions for details which were reviewed in writing and the patient given a copy highlighting the part that I personally wrote and discussed at today's ov.   See chronic resp failure sep a/p

## 2015-02-20 NOTE — Assessment & Plan Note (Signed)
SATURATION QUALIFICATIONS: 02/19/15 Patient Saturations on Room Air at Rest = 87% Patient Saturations on 2 Liters of oxygen while Ambulating = 95%  rec 02 24/7 and will provide her with appropriate portable system

## 2015-03-18 ENCOUNTER — Telehealth: Payer: Self-pay | Admitting: Emergency Medicine

## 2015-03-18 DIAGNOSIS — J9621 Acute and chronic respiratory failure with hypoxia: Secondary | ICD-10-CM

## 2015-03-18 NOTE — Telephone Encounter (Signed)
Spoke with pt. When she was here to see MW he wanted her to have oxygen. An order was not placed for this. Order will be placed per MW's documentation. Qualifying saturations are in the [facility] note. Nothing further was needed.

## 2015-03-26 ENCOUNTER — Encounter: Payer: Self-pay | Admitting: Emergency Medicine

## 2015-03-26 ENCOUNTER — Ambulatory Visit (INDEPENDENT_AMBULATORY_CARE_PROVIDER_SITE_OTHER): Payer: Medicare Other | Admitting: Emergency Medicine

## 2015-03-26 VITALS — BP 138/78 | HR 64 | Ht 66.0 in | Wt 92.4 lb

## 2015-03-26 DIAGNOSIS — J449 Chronic obstructive pulmonary disease, unspecified: Secondary | ICD-10-CM

## 2015-03-26 DIAGNOSIS — R918 Other nonspecific abnormal finding of lung field: Secondary | ICD-10-CM

## 2015-03-26 NOTE — Assessment & Plan Note (Signed)
With right middle lobe airway narrowing, chronic scar and some associated right middle lobe atelectasis. 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 I discussed this with her today and she did not recall that we had treated her for pneumonia in 2013 during her hospitalization. She did recall that she is also had some degree of fluid overload and a pleural effusion. Her chest x-ray and CT scan have never normalized since that episode. I do not see any significant difference in her recent scan 02/05/15 compared with 2013. We discussed the possible role for bronchoscopy to visualize her airways. She would like to avoid this if possible and I do not believe it is necessarily indicated given the stability of her CAT scans

## 2015-03-26 NOTE — Progress Notes (Signed)
Subjective:    Patient ID: Angel Hinton, female    DOB: 01/17/1941, 74 y.o.   MRN: [id number] HPI 76 female former smoker (09/14/2011)  seen for initial pulmonary\critical care consult during hospitalization. 09/18/2011 for acute on chronic hypoxic respiratory failure secondary to severe emphysema, pneumonia, pulmonary hypertension, and decompensated congestive heart failure.  [facility] follow up  10/26/11 -- Patient returns for a post hospital followup. Patient was admitted January 28 through February 5 , for severe shortness of breath. Patient was found to have acute congestive heart failure. She responded well to diuresis. A 2-D echo showed an EF of 65-70%, no regional wall motion abnormalities. Noted to have grade 1 diastolic dysfunction. She was noted to have elevated pulmonary artery pressures and suspected to have secondary pulmonary hypertension. Due to her severe COPD. She underwent a CT chest angiogram that showed no evidence of pulmonary emboli. There was a noted   Right lower lobe consolidation consistent with pneumonia, and she was treated with antibiotics, with good response. He was counseled on smoking cessation. She was started on Spiriva at discharge and recommended to have continuous oxygen therapy at home.  Since discharge. Patient reports she is feeling improved with decreased shortness of breath and cough. She denies any increased swelling. No fever or orthopnea.  She is not wearing her O2 as recommended. Sats on arrival today 87% on room air. We discussed O2 compliance.   ROV 11/27/11 -- Severe COPD, hx diastolic dysfxn. Admitted w R CAP as above.  On Spiriva. Only wearing her O2 at night. PFT today with very severe AFL (FEV1 0.84L). Very active even off her O2. She doesn't have easily portable O2. She is reluctant to wear the O2.   ROV 03/31/12 -- Severe COPD, hx diastolic dysfxn. Had been on Spiriva for 6 months, she asked to stop it in July because she was having severe  polyarthralgia. After stopping the spiriva, she didn;t reallly feel that she lost any ground with her breathing. No flares since last visit. She states that she still has SOB. She has never used her albuterol nebulizer. She doesn't use her O2. She wants me to d/c the O2 with exertion and to keep the nocturnal O2 to sleep with. She doesn't want me to start an new BD.   ROV 09/05/12 -- Severe COPD, hx diastolic dysfxn, hypoxemia. We had stopped spiriva before because of myalgias. She has albuterol to use as needed but never uses it. She has occasional SOB with exertion. She isn't as functional as she was 1-76yrs ago. She doesn't use her O2 w exertion, only at night. She is unwilling to wear o2 w exertion.   ROV 03/26/15 -- follow-up visit for known severe obstructive lung disease. Also documented hypoxemia.  She has O2 tanks at home but does not use reliably because she does not have a lightweight system. She has some chronic right middle lobe bronchial and distal scarring following a community-acquired pneumonia from 2013. A repeat CT scan of the chest was performed on 02/05/15 that I personally reviewed. I do not see any significant change compared with her prior in 2013.  We reviewed the scans today, discussed the potential risk for endobronchial lesion. I think the risk of this is low.  Objective:   Physical Exam Filed Vitals:   03/26/15 1152  BP: 138/78  Pulse: 64  Height: 5\' 6"  (1.676 m)  Weight: 92 lb 6.4 oz (41.912 kg)  SpO2: 93%   GEN: A/Ox3; pleasant , NAD, thin elderly female-frail   HEENT:  [place]/AT,  EACs-clear, TMs-wnl, NOSE-clear, THROAT-clear, no lesions, no postnasal drip or exudate noted.   NECK:  Supple w/ fair ROM; no JVD; normal carotid impulses w/o bruits; no thyromegaly or  nodules palpated; no lymphadenopathy.  RESP  Coarse BS , diminished in bases , no accessory muscle use, no dullness to percussion  CARD:  RRR, no m/r/g  , no peripheral edema, pulses intact, no cyanosis or clubbing.  Musco: Warm bil, no deformities or joint swelling noted.   Neuro: alert, no focal deficits noted.    Skin: Warm, no lesions or rashes   02/05/15 --  COMPARISON: CT chest of 09/21/2014  FINDINGS: Moderately severe centrilobular emphysema is noted. Some of the opacity noted previously within the right middle lobe has improved possibly having represented pneumonia. However partial collapse and scarring of the right middle lobe is unchanged. A lesion of the bronchus to the right middle lobe cannot be excluded. Bronchoscopy may be helpful to evaluate this area further. No focal infiltrate or effusion is currently seen.  On soft tissue window images, the thyroid gland is unremarkable. On this unenhanced study, again diffuse atheromatous change of the thoracic aorta is noted. No mediastinal or hilar adenopathy is seen. The main pulmonary arteries are prominent measuring up to 2.9 cm diameter probably indicating a degree of pulmonary arterial hypertension. The heart is mildly enlarged. A single hepatic granuloma which is calcified is noted in the right lobe of liver. There is curvature of the thoracic spine convex to the right but no compression deformity is seen.  IMPRESSION: 1. Persistent partial atelectasis of the right middle lobe although there has been improvement in the right middle lobe pneumonia. Cannot exclude a central endobronchial lesion involving the bronchus to the right middle lobe. 2. Severe centrilobular emphysema. 3. Prominent pulmonary arteries segments consistent with pulmonary arterial hypertension.     Assessment & Plan:  Abnormal CT scan of lung With right middle lobe airway narrowing, chronic scar and some associated right middle lobe  atelectasis. 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 I discussed this with her today and she did not recall that we had treated her for pneumonia in 2013 during her hospitalization. She did recall that she is also had some degree of fluid overload and a pleural effusion. Her chest x-ray and CT scan have never normalized since that episode. I do not see any significant difference in her recent scan 02/05/15 compared with 2013. We discussed the possible role for bronchoscopy to visualize her airways. She would like to avoid this if possible and I do not believe it is necessarily indicated given the stability of her CAT scans  COPD GOLD III/ 02 dep/hyercarbic She is very resistant to starting maintenance bronchodilator therapy. She is concerned about side effects. She has pro-air available to use that was given to her by Dr. Alroy Dust but she does not ever use it. She apparently has oxygen at home that has been available since her hospitalization in 2013. Again she doesn't use it with exertion but she tells me that this is because the system is too cumbersome. We will work on getting her more portable  system through [facility], either tanks or a portable O2 concentrator

## 2015-03-26 NOTE — Patient Instructions (Signed)
We discussed starting a scheduled inhaled medication to help her breathing today. For now we will wait on this based on that discussion Continue to have pro-air available to use if needed for sudden shortness of breath We will work with [facility] on obtaining a more portable oxygen system. This may consist of oxygen tanks or may consist of a portable oxygen concentrator depending on which system you prefer Your CT scan of the chest shows some right middle lobe scarring that appears to be unchanged compared with 2013. I do not believe we need to do any kind of invasive procedure to examine your airways at this time Follow with Dr Lamonte Sakai in 6 months or sooner if you have any problems \

## 2015-03-26 NOTE — Assessment & Plan Note (Signed)
She is very resistant to starting maintenance bronchodilator therapy. She is concerned about side effects. She has pro-air available to use that was given to her by Dr. Alroy Dust but she does not ever use it. She apparently has oxygen at home that has been available since her hospitalization in 2013. Again she doesn't use it with exertion but she tells me that this is because the system is too cumbersome. We will work on getting her more portable system through [facility], either tanks or a portable O2 concentrator

## 2015-05-16 DIAGNOSIS — Z1211 Encounter for screening for malignant neoplasm of colon: Secondary | ICD-10-CM | POA: Diagnosis not present

## 2015-05-16 DIAGNOSIS — J449 Chronic obstructive pulmonary disease, unspecified: Secondary | ICD-10-CM | POA: Diagnosis not present

## 2015-05-16 DIAGNOSIS — Z23 Encounter for immunization: Secondary | ICD-10-CM | POA: Diagnosis not present

## 2015-05-16 DIAGNOSIS — Z0000 Encounter for general adult medical examination without abnormal findings: Secondary | ICD-10-CM | POA: Diagnosis not present

## 2015-09-24 ENCOUNTER — Ambulatory Visit: Payer: PRIVATE HEALTH INSURANCE | Admitting: Emergency Medicine

## 2015-11-21 ENCOUNTER — Ambulatory Visit (INDEPENDENT_AMBULATORY_CARE_PROVIDER_SITE_OTHER): Payer: Medicare Other | Admitting: Emergency Medicine

## 2015-11-21 ENCOUNTER — Encounter: Payer: Self-pay | Admitting: Emergency Medicine

## 2015-11-21 VITALS — BP 132/72 | HR 78 | Ht 65.0 in | Wt 91.0 lb

## 2015-11-21 DIAGNOSIS — J449 Chronic obstructive pulmonary disease, unspecified: Secondary | ICD-10-CM

## 2015-11-21 DIAGNOSIS — R918 Other nonspecific abnormal finding of lung field: Secondary | ICD-10-CM

## 2015-11-21 NOTE — Patient Instructions (Addendum)
We discussed possibly adjusting your portable oxygen so that you can use it better with exertion. Please let us know if you are willing to try this and we will help you.  Please let us know if you would like to pursue adding an every-day inhaler to see if it helps your breathing.  Follow with Dr Lamonte Sakai in 12 months or sooner if you have any problems

## 2015-11-21 NOTE — Assessment & Plan Note (Signed)
Patient has severe COPD that is untreated with associated hypoxemia that is also untreated. She's desaturated on arrival today but does not want to wear supplemental oxygen. I discussed the benefits with her today. I also discussed possibly using a walker to allow her to transport poor blockage and more easily. She admits to me that she doesn't believe she'll do this the matter what arrangements we make. She also indicated that she doesn't want to start a scheduled bronchodilator. Again I indicated to her that she might benefit and with these benefits might be.for now I will leave her regimen unchanged

## 2015-11-21 NOTE — Assessment & Plan Note (Signed)
No plans to repeat CT scan at this time.

## 2015-11-21 NOTE — Progress Notes (Signed)
Subjective:    Patient ID: Angel Hinton, female    DOB: 1940-12-07, 75 y.o.   MRN: [id number] HPI [id number] female former smoker (09/14/2011)  seen for initial pulmonary\critical care consult during hospitalization. 09/18/2011 for acute on chronic hypoxic respiratory failure secondary to severe emphysema, pneumonia, pulmonary hypertension, and decompensated congestive heart failure.  [facility] follow up  10/26/11 -- Patient returns for a post hospital followup. Patient was admitted January 28 through February 5 , for severe shortness of breath. Patient was found to have acute congestive heart failure. She responded well to diuresis. A 2-D echo showed an EF of 65-70%, no regional wall motion abnormalities. Noted to have grade 1 diastolic dysfunction. She was noted to have elevated pulmonary artery pressures and suspected to have secondary pulmonary hypertension. Due to her severe COPD. She underwent a CT chest angiogram that showed no evidence of pulmonary emboli. There was a noted   Right lower lobe consolidation consistent with pneumonia, and she was treated with antibiotics, with good response. He was counseled on smoking cessation. She was started on Spiriva at discharge and recommended to have continuous oxygen therapy at home.  Since discharge. Patient reports she is feeling improved with decreased shortness of breath and cough. She denies any increased swelling. No fever or orthopnea.  She is not wearing her O2 as recommended. Sats on arrival today 87% on room air. We discussed O2 compliance.   ROV 11/27/11 -- Severe COPD, hx diastolic dysfxn. Admitted w R CAP as above.  On Spiriva. Only wearing her O2 at night. PFT today with very severe AFL (FEV1 0.84L). Very active even off her O2. She doesn't have easily portable O2. She is reluctant to wear the O2.   ROV 03/31/12 -- Severe COPD, hx diastolic dysfxn. Had been on Spiriva for 6 months, she asked to stop it in July because she was having severe  polyarthralgia. After stopping the spiriva, she didn;t reallly feel that she lost any ground with her breathing. No flares since last visit. She states that she still has SOB. She has never used her albuterol nebulizer. She doesn't use her O2. She wants me to d/c the O2 with exertion and to keep the nocturnal O2 to sleep with. She doesn't want me to start an new BD.   ROV 09/05/12 -- Severe COPD, hx diastolic dysfxn, hypoxemia. We had stopped spiriva before because of myalgias. She has albuterol to use as needed but never uses it. She has occasional SOB with exertion. She isn't as functional as she was 1-25yrs ago. She doesn't use her O2 w exertion, only at night. She is unwilling to wear o2 w exertion.   ROV 03/26/15 -- follow-up visit for known severe obstructive lung disease. Also documented hypoxemia.  She has O2 tanks at home but does not use reliably because she does not have a lightweight system. She has some chronic right middle lobe bronchial and distal scarring following a community-acquired pneumonia from 2013. A repeat CT scan of the chest was performed on 02/05/15 that I personally reviewed. I do not see any significant change compared with her prior in 2013.  We reviewed the scans today, discussed the potential risk for endobronchial lesion. I think the risk of this is low.                    ROV 11/21/15 -- follow-up visit for severe COPD and associated chronic hypoxemic respiratory failure. She also has some chronic right middle lobe scar associated  with a prior pneumonia. This was stable on her last CT scan on 02/05/15. She believes that she has more exertional dyspnea. We have been unable to find a portable O2 system that was light enough for her to carry. Discussed possibly getting a walker to allow her to carry it in a basket, but she doesn't want to use it when doing chores. She doesn't think she would use it reliably - sees it as a barrier to her functioning not a benefit.  She uses mucinex. She  has ProAir but never uses it.                                                                                                                                                   Objective:   Physical Exam Filed Vitals:   11/21/15 1401  BP: 132/72  Pulse: 78  Height: 5\' 5"  (1.651 m)  Weight: 91 lb (41.277 kg)  SpO2: 77%   GEN: A/Ox3; pleasant , NAD, thin elderly female-frail   HEENT:  [place]/AT,  EACs-clear, TMs-wnl, NOSE-clear, THROAT-clear, no lesions, no postnasal drip or exudate noted.   NECK:  Supple w/ fair ROM; no JVD; normal carotid impulses w/o bruits; no thyromegaly or nodules palpated; no lymphadenopathy.  RESP  Coarse BS , diminished in bases , no accessory muscle use, no dullness to percussion  CARD:  RRR, no m/r/g  , no peripheral edema, pulses intact, no cyanosis or clubbing.  Musco: Warm bil, no deformities or joint swelling noted.   Neuro: alert, no focal deficits noted.    Skin: Warm, no lesions or rashes   02/05/15 --  COMPARISON: CT chest of 09/21/2014  FINDINGS: Moderately severe centrilobular emphysema is noted. Some of the opacity noted previously within the right middle lobe has improved possibly having represented pneumonia. However partial collapse and scarring of the right middle lobe is unchanged. A lesion of the bronchus to the right middle lobe cannot be excluded. Bronchoscopy may be helpful to evaluate this area further. No focal infiltrate or effusion is currently seen.  On soft tissue window images, the thyroid gland is unremarkable. On this unenhanced study, again diffuse atheromatous change of the thoracic aorta is noted. No mediastinal or hilar adenopathy is seen. The main pulmonary arteries are prominent measuring up to 2.9 cm diameter probably indicating a degree of pulmonary arterial hypertension. The heart is mildly enlarged. A single hepatic granuloma which is calcified is noted in the right lobe of liver. There is curvature of  the thoracic spine convex to the right but no compression deformity is seen.  IMPRESSION: 1. Persistent partial atelectasis of the right middle lobe although there has been improvement in the right middle lobe pneumonia. Cannot exclude a central endobronchial lesion involving the bronchus to the right middle lobe. 2. Severe centrilobular emphysema. 3. Prominent pulmonary arteries segments consistent with  pulmonary arterial hypertension.     Assessment & Plan:  COPD GOLD III/ 02 dep/hyercarbic Patient has severe COPD that is untreated with associated hypoxemia that is also untreated. She's desaturated on arrival today but does not want to wear supplemental oxygen. I discussed the benefits with her today. I also discussed possibly using a walker to allow her to transport poor blockage and more easily. She admits to me that she doesn't believe she'll do this the matter what arrangements we make. She also indicated that she doesn't want to start a scheduled bronchodilator. Again I indicated to her that she might benefit and with these benefits might be.for now I will leave her regimen unchanged  Abnormal CT scan of lung No plans to repeat CT scan at this time.

## 2015-12-11 ENCOUNTER — Encounter (HOSPITAL_COMMUNITY): Payer: Self-pay | Admitting: Emergency Medicine

## 2015-12-11 ENCOUNTER — Emergency Department (HOSPITAL_COMMUNITY)
Admission: EM | Admit: 2015-12-11 | Discharge: 2015-12-11 | Disposition: A | Discharge status: Home / Self Care | Payer: Medicare Other | Attending: Emergency Medicine | Admitting: Emergency Medicine

## 2015-12-11 DIAGNOSIS — IMO0001 Reserved for inherently not codable concepts without codable children: Secondary | ICD-10-CM

## 2015-12-11 DIAGNOSIS — R0602 Shortness of breath: Secondary | ICD-10-CM | POA: Diagnosis present

## 2015-12-11 DIAGNOSIS — I509 Heart failure, unspecified: Secondary | ICD-10-CM | POA: Insufficient documentation

## 2015-12-11 DIAGNOSIS — Z880 Allergy status to penicillin: Secondary | ICD-10-CM | POA: Insufficient documentation

## 2015-12-11 DIAGNOSIS — R51 Headache: Secondary | ICD-10-CM | POA: Diagnosis not present

## 2015-12-11 DIAGNOSIS — I10 Essential (primary) hypertension: Secondary | ICD-10-CM | POA: Diagnosis not present

## 2015-12-11 DIAGNOSIS — J449 Chronic obstructive pulmonary disease, unspecified: Secondary | ICD-10-CM | POA: Diagnosis not present

## 2015-12-11 DIAGNOSIS — Z87891 Personal history of nicotine dependence: Secondary | ICD-10-CM | POA: Diagnosis not present

## 2015-12-11 DIAGNOSIS — R030 Elevated blood-pressure reading, without diagnosis of hypertension: Secondary | ICD-10-CM | POA: Diagnosis not present

## 2015-12-11 DIAGNOSIS — J441 Chronic obstructive pulmonary disease with (acute) exacerbation: Secondary | ICD-10-CM | POA: Insufficient documentation

## 2015-12-11 LAB — BASIC METABOLIC PANEL
ANION GAP: 9 (ref 5–15)
BUN: 10 mg/dL (ref 6–20)
CALCIUM: 9.5 mg/dL (ref 8.9–10.3)
CO2: 29 mmol/L (ref 22–32)
Chloride: 104 mmol/L (ref 101–111)
Creatinine, Ser: 0.47 mg/dL (ref 0.44–1.00)
Glucose, Bld: 107 mg/dL — ABNORMAL HIGH (ref 65–99)
Potassium: 5.3 mmol/L — ABNORMAL HIGH (ref 3.5–5.1)
Sodium: 142 mmol/L (ref 135–145)

## 2015-12-11 LAB — CBC
HCT: 48.1 % — ABNORMAL HIGH (ref 36.0–46.0)
HEMOGLOBIN: 15.1 g/dL — AB (ref 12.0–15.0)
MCH: 29.3 pg (ref 26.0–34.0)
MCHC: 31.4 g/dL (ref 30.0–36.0)
MCV: 93.4 fL (ref 78.0–100.0)
Platelets: 220 10*3/uL (ref 150–400)
RBC: 5.15 MIL/uL — AB (ref 3.87–5.11)
RDW: 14.2 % (ref 11.5–15.5)
WBC: 8.6 10*3/uL (ref 4.0–10.5)

## 2015-12-11 MED ORDER — ALBUTEROL SULFATE (2.5 MG/3ML) 0.083% IN NEBU
5.0000 mg | INHALATION_SOLUTION | Freq: Once | RESPIRATORY_TRACT | Status: AC
  Administered 2015-12-11: 5 mg via RESPIRATORY_TRACT
  Filled 2015-12-11: qty 6

## 2015-12-11 NOTE — ED Notes (Signed)
Pt brought to ED by [facility] from home for HTN and SOB, BP 230/160 on EMS arrival, HR 78, pt denies any CP or discomfort state she just very SOB, Hx of COPD, no complaint with O2 at home pt on the low 80% on RA 98 % on 3L [place].

## 2015-12-11 NOTE — ED Notes (Signed)
NAD at this time. Pt is stable and going home.  

## 2015-12-11 NOTE — ED Provider Notes (Signed)
CSN: [id number]     Arrival date & time 12/11/15  0145 History   By signing my name below, I, [name], attest that this documentation has been prepared under the direction and in the presence of Varney Biles, MD.  Electronically Signed: [name], ED Scribe. 12/11/2015. 3:55 AM.   Chief Complaint  Patient presents with  . Hypertension  . Shortness of Breath   The history is provided by the patient. No language interpreter was used.    HPI Comments: Angel Hinton brought in by EMS is a 75 y.o. female with a PMHx of COPD and CHF who presents to the Emergency Department here for hypertension this evening. Pt states she experienced some pain in the back on her head along with jaw pain last night. She then checked her blood pressure this evening with a systolic reading in the "123456". Per triage note, blood pressure was 230/160 en route to department. She is not currently on any blood pressure medications and states her blood pressure does not usually run high. Pt also reports worsening of shortness of breath this evening - with exertion. Typically pt is on 2.5 liters of Oxygen at home which typically helps manage her baseline shortness of breath. She denies any aggravating or alleviating factors at this time. DIB not worse with laying flat. No OTC medications or home remedies attempted prior to arrival. Pt denies any fever, chills, chest pain, diaphoresis, cough, nausea, or vomiting.  PCP: Donnie Coffin, MD    Past Medical History  Diagnosis Date  . COPD (chronic obstructive pulmonary disease) ([place])   . CHF (congestive heart failure) ([place])    History reviewed. No pertinent past surgical history. Family History  Problem Relation Age of Onset  . Hypertension Father   . Hypertension Sister   . Diabetes Sister    Social History  Substance Use Topics  . Smoking status: Former Smoker -- 1.00 packs/day for 49 years    Types: Cigarettes    Quit date: 09/14/2011  . Smokeless tobacco:  Never Used  . Alcohol Use: No   OB History    No data available     Review of Systems  Constitutional: Negative for fever and chills.  Respiratory: Positive for shortness of breath. Negative for cough.   Cardiovascular: Negative for chest pain.  Gastrointestinal: Negative for nausea, vomiting and abdominal pain.  Neurological: Negative for headaches.  Psychiatric/Behavioral: Negative for confusion.  All other systems reviewed and are negative.     Allergies  Erythromycin; Amoxicillin; and Amoxicillin-pot clavulanate  Home Medications   Prior to Admission medications   Medication Sig Start Date End Date Taking? Authorizing Provider  UNABLE TO FIND Med Name: DME- [facility]  Oxygen 2lpm 24/7    Historical Provider, MD   Triage Vitals: BP 117/49 mmHg  Pulse 68  Temp(Src) 97.5 F (36.4 C) (Oral)  Resp 13  Ht 5\' 5"  (1.651 m)  Wt 91 lb (41.277 kg)  BMI 15.14 kg/m2  SpO2 97%   Physical Exam  Constitutional: She is oriented to person, place, and time. She appears well-developed and well-nourished. No distress.  HENT:  Head: Normocephalic and atraumatic.  Eyes: EOM are normal.  Neck: Normal range of motion.  Cardiovascular: Normal rate, regular rhythm and normal heart sounds.   Pulmonary/Chest: She has no wheezes.  Tightness noted, however, no wheezing noted  Abdominal: Soft. She exhibits no distension. There is no tenderness.  Musculoskeletal: Normal range of motion. She exhibits no edema.  No pitting edema or  unilateral calf swelling noted   Neurological: She is alert and oriented to person, place, and time.  Skin: Skin is warm and dry.  Psychiatric: She has a normal mood and affect. Judgment normal.  Nursing note and vitals reviewed.   ED Course  Procedures (including critical care time)  DIAGNOSTIC STUDIES: Oxygen Saturation is 94% on RA, adequate by my interpretation.    COORDINATION OF CARE: 3:50 AM- Will order EKG and blood work. Will give breathing treatment.  Discussed treatment plan with pt at bedside and pt agreed to plan.     Labs Review Labs Reviewed  BASIC METABOLIC PANEL - Abnormal; Notable for the following:    Potassium 5.3 (*)    Glucose, Bld 107 (*)    All other components within normal limits  CBC - Abnormal; Notable for the following:    RBC 5.15 (*)    Hemoglobin 15.1 (*)    HCT 48.1 (*)    All other components within normal limits    Imaging Review No results found. I have personally reviewed and evaluated these images and lab results as part of my medical decision-making.   EKG Interpretation   Date/Time:  Wednesday December 11 2015 01:54:16 EDT Ventricular Rate:  70 PR Interval:  157 QRS Duration: 109 QT Interval:  440 QTC Calculation: 475 R Axis:   -70 Text Interpretation:  Sinus rhythm Incomplete RBBB and LAFB Anterior  infarct, old No acute changes No significant change since last tracing  Confirmed by Kathrynn Humble, MD, Thelma Comp [phone]) on 12/11/2015 4:51:40 AM Also  confirmed by Kathrynn Humble, MD, Thelma Comp [phone]), editor Gilford Rile, CCT, [place]  ([id number])  on 12/11/2015 6:57:37 AM      MDM   Final diagnoses:  COPD bronchitis  Elevated BP   I personally performed the services described in this documentation, which was scribed in my presence. The recorded information has been reviewed and is accurate.  Pt comes in with cc of dib and elevated BP. She had elevated BP per EMS - but she denies any chest pain. She is comfortable at rest, but with exertion she had some dib. Hx of COPD, and she thinks the dib was related to her copd. Lungs - no rales, no wheezing. She denies new cough.   Clinical concerns for ACS low. Screening ekg ordered.. Clinical concerns for CHF or acute copd exacerbation also low.  Pt ambulated to the bathroom - and no severe resp distress noted. O2 sats are stable, lungs are clear.  Will monitor her in the ER, but anticipate d/c.  Pt's BP is high - but no acute symptoms noted from it. Will watch it  closely. Advised that she see her PCP soon for further evaluation of BP and that it was best not to start her on any meds from the ER.  Strict ER return precautions have been discussed, and patient is agreeing with the plan and is comfortable with the workup done and the recommendations from the ER.   Varney Biles, MD 12/11/15 [id number]

## 2015-12-11 NOTE — Discharge Instructions (Signed)
See your primary doctor for BP evaluation. Though your BP was high initially, it came back to normal limits overtime on its own.   How to Take Your Blood Pressure HOW DO I GET A BLOOD PRESSURE MACHINE?  You can buy an electronic home blood pressure machine at your local pharmacy. Insurance will sometimes cover the cost if you have a prescription.  Ask your doctor what type of machine is best for you. There are different machines for your arm and your wrist.  If you decide to buy a machine to check your blood pressure on your arm, first check the size of your arm so you can buy the right size cuff. To check the size of your arm:   Use a measuring tape that shows both inches and centimeters.   Wrap the measuring tape around the upper-middle part of your arm. You may need someone to help you measure.   Write down your arm measurement in both inches and centimeters.   To measure your blood pressure correctly, it is important to have the right size cuff.   If your arm is up to 13 inches (up to 34 centimeters), get an adult cuff size.  If your arm is 13 to 17 inches (35 to 44 centimeters), get a large adult cuff size.    If your arm is 17 to 20 inches (45 to 52 centimeters), get an adult thigh cuff.  WHAT DO THE NUMBERS MEAN?   There are two numbers that make up your blood pressure. For example: 120/80.  The first number (120 in our example) is called the "systolic pressure." It is a measure of the pressure in your blood vessels when your heart is pumping blood.  The second number (80 in our example) is called the "diastolic pressure." It is a measure of the pressure in your blood vessels when your heart is resting between beats.  Your doctor will tell you what your blood pressure should be. WHAT SHOULD I DO BEFORE I CHECK MY BLOOD PRESSURE?   Try to rest or relax for at least 30 minutes before you check your blood pressure.  Do not smoke.  Do not have any drinks with  caffeine, such as:  Soda.  Coffee.  Tea.  Check your blood pressure in a quiet room.  Sit down and stretch out your arm on a table. Keep your arm at about the level of your heart. Let your arm relax.  Make sure that your legs are not crossed. HOW DO I CHECK MY BLOOD PRESSURE?  Follow the directions that came with your machine.  Make sure you remove any tight-fitting clothing from your arm or wrist. Wrap the cuff around your upper arm or wrist. You should be able to fit a finger between the cuff and your arm. If you cannot fit a finger between the cuff and your arm, it is too tight and should be removed and rewrapped.  Some units require you to manually pump up the arm cuff.  Automatic units inflate the cuff when you press a button.  Cuff deflation is automatic in both models.  After the cuff is inflated, the unit measures your blood pressure and pulse. The readings are shown on a monitor. Hold still and breathe normally while the cuff is inflated.  Getting a reading takes less than a minute.  Some models store readings in a memory. Some provide a printout of readings. If your machine does not store your readings, keep a written  record.  Take readings with you to your next visit with your doctor.   This information is not intended to replace advice given to you by your health care provider. Make sure you discuss any questions you have with your health care provider.   Document Released: 07/16/2008 Document Revised: 08/24/2014 Document Reviewed: 09/28/2013 Elsevier Interactive Patient Education 2016 [employer].  Hypertension Hypertension, commonly called high blood pressure, is when the force of blood pumping through your arteries is too strong. Your arteries are the blood vessels that carry blood from your heart throughout your body. A blood pressure reading consists of a higher number over a lower number, such as 110/72. The higher number (systolic) is the pressure inside  your arteries when your heart pumps. The lower number (diastolic) is the pressure inside your arteries when your heart relaxes. Ideally you want your blood pressure below 120/80. Hypertension forces your heart to work harder to pump blood. Your arteries may become narrow or stiff. Having untreated or uncontrolled hypertension can cause heart attack, stroke, kidney disease, and other problems. RISK FACTORS Some risk factors for high blood pressure are controllable. Others are not.  Risk factors you cannot control include:   Race. You may be at higher risk if you are African American.  Age. Risk increases with age.  Gender. Men are at higher risk than women before age 61 years. After age 78, women are at higher risk than men. Risk factors you can control include:  Not getting enough exercise or physical activity.  Being overweight.  Getting too much fat, sugar, calories, or salt in your diet.  Drinking too much alcohol. SIGNS AND SYMPTOMS Hypertension does not usually cause signs or symptoms. Extremely high blood pressure (hypertensive crisis) may cause headache, anxiety, shortness of breath, and nosebleed. DIAGNOSIS To check if you have hypertension, your health care provider will measure your blood pressure while you are seated, with your arm held at the level of your heart. It should be measured at least twice using the same arm. Certain conditions can cause a difference in blood pressure between your right and left arms. A blood pressure reading that is higher than normal on one occasion does not mean that you need treatment. If it is not clear whether you have high blood pressure, you may be asked to return on a different day to have your blood pressure checked again. Or, you may be asked to monitor your blood pressure at home for 1 or more weeks. TREATMENT Treating high blood pressure includes making lifestyle changes and possibly taking medicine. Living a healthy lifestyle can help  lower high blood pressure. You may need to change some of your habits. Lifestyle changes may include:  Following the DASH diet. This diet is high in fruits, vegetables, and whole grains. It is low in salt, red meat, and added sugars.  Keep your sodium intake below 2,300 mg per day.  Getting at least 30-45 minutes of aerobic exercise at least 4 times per week.  Losing weight if necessary.  Not smoking.  Limiting alcoholic beverages.  Learning ways to reduce stress. Your health care provider may prescribe medicine if lifestyle changes are not enough to get your blood pressure under control, and if one of the following is true:  You are 12-49 years of age and your systolic blood pressure is above 140.  You are 79 years of age or older, and your systolic blood pressure is above 150.  Your diastolic blood pressure is above 90.  You have diabetes, and your systolic blood pressure is over [id number] or your diastolic blood pressure is over 90.  You have kidney disease and your blood pressure is above 140/90.  You have heart disease and your blood pressure is above 140/90. Your personal target blood pressure may vary depending on your medical conditions, your age, and other factors. HOME CARE INSTRUCTIONS  Have your blood pressure rechecked as directed by your health care provider.   Take medicines only as directed by your health care provider. Follow the directions carefully. Blood pressure medicines must be taken as prescribed. The medicine does not work as well when you skip doses. Skipping doses also puts you at risk for problems.  Do not smoke.   Monitor your blood pressure at home as directed by your health care provider. SEEK MEDICAL CARE IF:   You think you are having a reaction to medicines taken.  You have recurrent headaches or feel dizzy.  You have swelling in your ankles.  You have trouble with your vision. SEEK IMMEDIATE MEDICAL CARE IF:  You develop a severe  headache or confusion.  You have unusual weakness, numbness, or feel faint.  You have severe chest or abdominal pain.  You vomit repeatedly.  You have trouble breathing. MAKE SURE YOU:   Understand these instructions.  Will watch your condition.  Will get help right away if you are not doing well or get worse.   This information is not intended to replace advice given to you by your health care provider. Make sure you discuss any questions you have with your health care provider.   Document Released: 08/03/2005 Document Revised: 12/18/2014 Document Reviewed: 05/26/2013 Elsevier Interactive Patient Education [employer].

## 2016-05-20 DIAGNOSIS — I10 Essential (primary) hypertension: Secondary | ICD-10-CM | POA: Diagnosis not present

## 2016-05-20 DIAGNOSIS — J449 Chronic obstructive pulmonary disease, unspecified: Secondary | ICD-10-CM | POA: Diagnosis not present

## 2016-05-20 DIAGNOSIS — Z23 Encounter for immunization: Secondary | ICD-10-CM | POA: Diagnosis not present

## 2016-05-20 DIAGNOSIS — Z0000 Encounter for general adult medical examination without abnormal findings: Secondary | ICD-10-CM | POA: Diagnosis not present

## 2016-10-06 ENCOUNTER — Telehealth: Payer: Self-pay | Admitting: Emergency Medicine

## 2016-10-06 NOTE — Telephone Encounter (Signed)
Spoke with pt. Advised her that we can order this for her but she will need an OV and to be retested for oxygen use. Pt agreed and verbalized understanding. States that she would like to wait until her annual appointment which should be in April. Attempted to schedule this appointment but she stated that she would have to call back in the AM to schedule this.

## 2016-10-06 NOTE — Telephone Encounter (Signed)
Okay to write a prescription for this but I'm not sure that [facility] provides it. She may have to look elsewhere.

## 2016-10-06 NOTE — Telephone Encounter (Signed)
Called and spoke with pt and she stated that she is wanting to change to the innogen 1 due to this being more light and able to manage better.  Pt stated that she uses [facility].  RB please advise. thanks

## 2016-12-08 ENCOUNTER — Encounter: Payer: Self-pay | Admitting: Emergency Medicine

## 2016-12-08 ENCOUNTER — Ambulatory Visit (INDEPENDENT_AMBULATORY_CARE_PROVIDER_SITE_OTHER): Payer: Medicare Other | Admitting: Emergency Medicine

## 2016-12-08 DIAGNOSIS — J9611 Chronic respiratory failure with hypoxia: Secondary | ICD-10-CM

## 2016-12-08 DIAGNOSIS — J449 Chronic obstructive pulmonary disease, unspecified: Secondary | ICD-10-CM | POA: Diagnosis not present

## 2016-12-08 MED ORDER — TIOTROPIUM BROMIDE-OLODATEROL 2.5-2.5 MCG/ACT IN AERS: 2.0000 | INHALATION_SPRAY | Freq: Every day | RESPIRATORY_TRACT | 0 refills | Status: DC

## 2016-12-08 NOTE — Assessment & Plan Note (Signed)
Severe disease that is not currently being treated. She has been very reticent to start scheduled bronchodilators. Try Stiolto to see if she benefits although I'm not sure she really wants to do this. She is willing to do a trial. If she experiences a significant clinical improvement then she will call to let us know whether to send this to her pharmacy.

## 2016-12-08 NOTE — Progress Notes (Signed)
Subjective:    Patient ID: Angel Hinton, female    DOB: 02/15/1941, 76 y.o.   MRN: [id number] HPI 57 female former smoker (09/14/2011)  seen for initial pulmonary\critical care consult during hospitalization. 09/18/2011 for acute on chronic hypoxic respiratory failure secondary to severe emphysema, pneumonia, pulmonary hypertension, and decompensated congestive heart failure.   ROV 03/26/15 -- follow-up visit for known severe obstructive lung disease. Also documented hypoxemia.  She has O2 tanks at home but does not use reliably because she does not have a lightweight system. She has some chronic right middle lobe bronchial and distal scarring following a community-acquired pneumonia from 2013. A repeat CT scan of the chest was performed on 02/05/15 that I personally reviewed. I do not see any significant change compared with her prior in 2013.  We reviewed the scans today, discussed the potential risk for endobronchial lesion. I think the risk of this is low.                    ROV 11/21/15 -- follow-up visit for severe COPD and associated chronic hypoxemic respiratory failure. She also has some chronic right middle lobe scar associated with a prior pneumonia. This was stable on her last CT scan on 02/05/15. She believes that she has more exertional dyspnea. We have been unable to find a portable O2 system that was light enough for her to carry. Discussed possibly getting a walker to allow her to carry it in a basket, but she doesn't want to use it when doing chores. She doesn't think she would use it reliably - sees it as a barrier to her functioning not a benefit.  She uses mucinex. She has ProAir but never uses it.                                                                                                                                              ROV 12/08/16 -- Patient has a history of severe COPD, chronic hypoxemic respiratory failure, chronic right middle lobe scarring following a prior pneumonia.  Her last CT scan of the chest was 02/05/15. Supplemental oxygen but does not use it because it is quite difficult for her to carry a portable device. Likewise she does not use a scheduled bronchodilator. We have discussed this and she has wanted to defer. She reports that she is having more exertional SOB. She uses her o2 at home. She does not take it when she goes out. She wants to get an Inogen One POC.       Objective:   Physical Exam Vitals:   12/08/16 1417  BP: 138/82  Pulse: 66  SpO2: 96%  Weight: 89 lb 12.8 oz (40.7 kg)  Height: 5\' 5"  (1.651 m)   GEN: A/Ox3; pleasant , NAD, thin elderly female-frail    HEENT:  [place]/AT,  EACs-clear, TMs-wnl, NOSE-clear, THROAT-clear, no lesions,  no postnasal drip or exudate noted.   NECK:  Supple w/ fair ROM; no JVD; normal carotid impulses w/o bruits; no thyromegaly or nodules palpated; no lymphadenopathy.    RESP  Coarse BS , diminished in bases , no accessory muscle use, no dullness to percussion  CARD:  RRR, no m/r/g  , no peripheral edema, pulses intact, no cyanosis or clubbing.   Musco: Warm bil, no deformities or joint swelling noted.   Neuro: alert, no focal deficits noted.    Skin: Warm, no lesions or rashes   02/05/15 --  COMPARISON: CT chest of 09/21/2014  FINDINGS: Moderately severe centrilobular emphysema is noted. Some of the opacity noted previously within the right middle lobe has improved possibly having represented pneumonia. However partial collapse and scarring of the right middle lobe is unchanged. A lesion of the bronchus to the right middle lobe cannot be excluded. Bronchoscopy may be helpful to evaluate this area further. No focal infiltrate or effusion is currently seen.  On soft tissue window images, the thyroid gland is unremarkable. On this unenhanced study, again diffuse atheromatous change of the thoracic aorta is noted. No mediastinal or hilar adenopathy is seen. The main pulmonary arteries are  prominent measuring up to 2.9 cm diameter probably indicating a degree of pulmonary arterial hypertension. The heart is mildly enlarged. A single hepatic granuloma which is calcified is noted in the right lobe of liver. There is curvature of the thoracic spine convex to the right but no compression deformity is seen.  IMPRESSION: 1. Persistent partial atelectasis of the right middle lobe although there has been improvement in the right middle lobe pneumonia. Cannot exclude a central endobronchial lesion involving the bronchus to the right middle lobe. 2. Severe centrilobular emphysema. 3. Prominent pulmonary arteries segments consistent with pulmonary arterial hypertension.     Assessment & Plan:  COPD GOLD C-D Severe disease that is not currently being treated. She has been very reticent to start scheduled bronchodilators. Try Stiolto to see if she benefits although I'm not sure she really wants to do this. She is willing to do a trial. If she experiences a significant clinical improvement then she will call to let us know whether to send this to her pharmacy.   Chronic respiratory failure (HCC) Chronic respiratory failure with hypoxemia. She wears her oxygen while at home but has never been able to find a good portable device. She is interested in getting Inogen One g4 POC. We will order this.   Baltazar Apo, MD, PhD 12/08/2016, 2:34 PM [facility] [phone] or if no answer [phone]

## 2016-12-08 NOTE — Addendum Note (Signed)
Addended by: Jannette Spanner on: 12/08/2016 04:27 PM   Modules accepted: Orders

## 2016-12-08 NOTE — Assessment & Plan Note (Signed)
Chronic respiratory failure with hypoxemia. She wears her oxygen while at home but has never been able to find a good portable device. She is interested in getting Inogen One g4 POC. We will order this.

## 2016-12-08 NOTE — Addendum Note (Signed)
Addended by: Jannette Spanner on: 12/08/2016 03:03 PM   Modules accepted: Orders

## 2016-12-08 NOTE — Patient Instructions (Signed)
We will perform a trial of Stiolto 2 puffs once a day to see if you benefit. Please call our office if it helps you and we will order this through your pharmacy.  We will work on getting your an Inogen One Therapist, nutritional.  Follow with Dr Lamonte Sakai in 4 months or sooner if you have any problems.

## 2016-12-09 ENCOUNTER — Telehealth: Payer: Self-pay | Admitting: Emergency Medicine

## 2016-12-09 DIAGNOSIS — J449 Chronic obstructive pulmonary disease, unspecified: Secondary | ICD-10-CM

## 2016-12-09 DIAGNOSIS — J9611 Chronic respiratory failure with hypoxia: Secondary | ICD-10-CM

## 2016-12-09 NOTE — Telephone Encounter (Signed)
lmomtcb x1 

## 2016-12-09 NOTE — Telephone Encounter (Signed)
Spoke with Corene Cornea, who states he needs pt's qualifying stats and that also notes state pt wants an inogen poc so if that is the case then the order needs to go to them. Called pt she wants the inogen poc.Marland Kitchen  Spoke with Judeen Hammans who stated a new order needs to be placed. Order was done. Nothing further is needed

## 2016-12-10 NOTE — Telephone Encounter (Signed)
Destiny from [facility] called returning Tamara's call - she can be reached at [phone]- pr

## 2016-12-10 NOTE — Telephone Encounter (Signed)
Pt called the office requesting to speak with someone Spoke with patient who asked if the order was sent to [employer] - assured patient this was done yesterday 4.25.18.  Pt then asked if she will be able to keep her home concentrator in addition to the POC.  Advised pt will need to double-check this since she is going through 2 separate companies.  Called [facility] and spoke with [place].  Per [place] she did not receive qualifying sats from the visit with RB and requested these verbally and faxed - per Care Coordination Note pt was 73% on room at rest.  Per Destiney this is sufficient.  Snapshot with that information faxed to Destiney's attn @ [phone].  Also, per Destiney pt will be able to continue to keep her home O2 equipment with [facility] with this qualifying information.  Called spoke with patient and informed her of the above Pt voiced her understanding Nothing further needed; will sign off

## 2017-04-13 ENCOUNTER — Ambulatory Visit: Payer: Medicare Other | Admitting: Emergency Medicine

## 2017-05-25 ENCOUNTER — Ambulatory Visit: Payer: Medicare Other | Admitting: Emergency Medicine

## 2017-06-30 ENCOUNTER — Ambulatory Visit: Payer: Medicare Other | Admitting: Emergency Medicine

## 2017-07-14 DIAGNOSIS — I10 Essential (primary) hypertension: Secondary | ICD-10-CM | POA: Diagnosis not present

## 2017-07-14 DIAGNOSIS — J449 Chronic obstructive pulmonary disease, unspecified: Secondary | ICD-10-CM | POA: Diagnosis not present

## 2017-07-14 DIAGNOSIS — Z0000 Encounter for general adult medical examination without abnormal findings: Secondary | ICD-10-CM | POA: Diagnosis not present

## 2017-07-14 DIAGNOSIS — Z23 Encounter for immunization: Secondary | ICD-10-CM | POA: Diagnosis not present

## 2017-07-19 ENCOUNTER — Encounter: Payer: Self-pay | Admitting: Emergency Medicine

## 2017-07-19 ENCOUNTER — Ambulatory Visit (INDEPENDENT_AMBULATORY_CARE_PROVIDER_SITE_OTHER): Payer: Medicare Other | Admitting: Emergency Medicine

## 2017-07-19 DIAGNOSIS — J449 Chronic obstructive pulmonary disease, unspecified: Secondary | ICD-10-CM

## 2017-07-19 DIAGNOSIS — J9611 Chronic respiratory failure with hypoxia: Secondary | ICD-10-CM

## 2017-07-19 NOTE — Patient Instructions (Signed)
You would benefit from taking an inhaled medication for your COPD / emphysema. If you are ever willing to use one, then we will be happy to order one for you.  We will work on getting you a portable oxygen concentrator from [employer]. We will have them perform an oxygen titration to see how much oxygen you need.  Flu shot up to date Follow with Dr Lamonte Sakai in 6 months or sooner if you have any problems

## 2017-07-19 NOTE — Assessment & Plan Note (Signed)
We will order a portable o2 concentrator, assess her ability to tolerate pulsed flow, titrate for her needs.

## 2017-07-19 NOTE — Progress Notes (Signed)
Subjective:    Patient ID: Angel Hinton, female    DOB: 1940/08/22, 76 y.o.   MRN: [id number] HPI 76 female former smoker (09/14/2011)  seen for initial pulmonary\critical care consult during hospitalization. 09/18/2011 for acute on chronic hypoxic respiratory failure secondary to severe emphysema, pneumonia, pulmonary hypertension, and decompensated congestive heart failure.  ROV 12/08/16 -- Patient has a history of severe COPD, chronic hypoxemic respiratory failure, chronic right middle lobe scarring following a prior pneumonia. Her last CT scan of the chest was 02/05/15. Supplemental oxygen but does not use it because it is quite difficult for her to carry a portable device. Likewise she does not use a scheduled bronchodilator. We have discussed this and she has wanted to defer. She reports that she is having more exertional SOB. She uses her o2 at home. She does not take it when she goes out. She wants to get an Inogen One POC.   ROV 07/19/17 --76 year old woman with a history of severe emphysematous COPD, chronic hypoxemic respiratory failure and chronic right middle lobe scarring following a prior pneumonia.  At her last visit we discussed getting her a portable oxygen concentrator, she never got it due to cost.  I also asked her to do a trial of Stiolto, but she didn't take it.  She is reticent to start any meds, distrusts them.  She did get the flu shot      Objective:   Physical Exam Vitals:   07/19/17 1403  BP: 136/70  Pulse: 81  SpO2: 97%  Weight: 86 lb (39 kg)  Height: 5\' 5"  (1.651 m)   Gen: Pleasant, well-nourished, in no distress,  normal affect  ENT: No lesions,  mouth clear,  oropharynx clear, no postnasal drip  Neck: No JVD, no TMG, no carotid bruits  Lungs: No use of accessory muscles, no dullness to percussion, clear without rales or rhonchi  Cardiovascular: RRR, heart sounds normal, no murmur or gallops, no peripheral edema  Musculoskeletal: No deformities, no  cyanosis or clubbing  Neuro: alert, non focal  Skin: Warm, no lesions or rash   02/05/15 --  COMPARISON: CT chest of 09/21/2014  FINDINGS: Moderately severe centrilobular emphysema is noted. Some of the opacity noted previously within the right middle lobe has improved possibly having represented pneumonia. However partial collapse and scarring of the right middle lobe is unchanged. A lesion of the bronchus to the right middle lobe cannot be excluded. Bronchoscopy may be helpful to evaluate this area further. No focal infiltrate or effusion is currently seen.  On soft tissue window images, the thyroid gland is unremarkable. On this unenhanced study, again diffuse atheromatous change of the thoracic aorta is noted. No mediastinal or hilar adenopathy is seen. The main pulmonary arteries are prominent measuring up to 2.9 cm diameter probably indicating a degree of pulmonary arterial hypertension. The heart is mildly enlarged. A single hepatic granuloma which is calcified is noted in the right lobe of liver. There is curvature of the thoracic spine convex to the right but no compression deformity is seen.  IMPRESSION: 1. Persistent partial atelectasis of the right middle lobe although there has been improvement in the right middle lobe pneumonia. Cannot exclude a central endobronchial lesion involving the bronchus to the right middle lobe. 2. Severe centrilobular emphysema. 3. Prominent pulmonary arteries segments consistent with pulmonary arterial hypertension.     Assessment & Plan:  COPD GOLD C-D Progressive dyspnea and hypoxemia.  She has not allowed [place] to start any scheduled bronchodilators.  She  has some psychological barriers and distressed regarding inhaled medication so she will not take them.  She is using oxygen and I will assess to see if she could tolerate a portable oxygen concentrator, trigger pulse flow, titrate to her needs to keep her adequately saturated.  She did get the flu shot.  If and when she is willing to start inhaled medication I will gladly initiate  Chronic respiratory failure ([place]) We will order a portable o2 concentrator, assess her ability to tolerate pulsed flow, titrate for her needs.   Baltazar Apo, MD, PhD 07/19/2017, 2:20 PM [facility] [phone] or if no answer [phone]

## 2017-07-19 NOTE — Assessment & Plan Note (Signed)
Progressive dyspnea and hypoxemia.  She has not allowed [place] to start any scheduled bronchodilators.  She has some psychological barriers and distressed regarding inhaled medication so she will not take them.  She is using oxygen and I will assess to see if she could tolerate a portable oxygen concentrator, trigger pulse flow, titrate to her needs to keep her adequately saturated. She did get the flu shot.  If and when she is willing to start inhaled medication I will gladly initiate

## 2018-01-18 ENCOUNTER — Telehealth: Payer: Self-pay | Admitting: Emergency Medicine

## 2018-01-18 NOTE — Progress Notes (Deleted)
_0  ID: Angel Hinton, female    DOB: 06-14-1941, 77 y.o.   MRN: [id number]  No chief complaint on file.   Referring provider: Alroy Dust, L.Marlou Sa, MD  HPI: 52 female former smoker (quit - 09/14/2011)  seen for initial pulmonary\critical care consult during hospitalization. 09/18/2011 for acute on chronic hypoxic respiratory failure secondary to severe emphysema, pneumonia, pulmonary hypertension, and decompensated congestive heart failure.  Recent [facility] Encounters:   ROV 12/08/16 -- Patient has a history of severe COPD, chronic hypoxemic respiratory failure, chronic right middle lobe scarring following a prior pneumonia. Her last CT scan of the chest was 02/05/15. Supplemental oxygen but does not use it because it is quite difficult for her to carry a portable device. Likewise she does not use a scheduled bronchodilator. We have discussed this and she has wanted to defer. She reports that she is having more exertional SOB. She uses her o2 at home. She does not take it when she goes out. She wants to get an Inogen One POC.   ROV 07/19/17 --77 year old woman with a history of severe emphysematous COPD, chronic hypoxemic respiratory failure and chronic right middle lobe scarring following a prior pneumonia.  At her last visit we discussed getting her a portable oxygen concentrator, she never got it due to cost.  I also asked her to do a trial of Stiolto, but she didn't take it.  She is reticent to start any meds, distrusts them.  She did get the flu shot  Tests:   Imaging:  02/05/2015-CT chest without contrast-persistent partial atelectasis of the right middle lobe although there has been improvement in the right middle lobe pneumonia, cannot exclude a central antero-bronchial lesion involving the bronchus, severe centrilobular emphysema, prominent pulmonary artery segments consistent with pulmonary arterial hypertension 09/14/2014-chest x-ray- no acute cardiopulmonary disease,  hyperexpanded lungs with chronic opacities within the right middle lobe worrisome for atypical infection like MAC/MAI.   Cardiac:   09/15/2011-echocardiogram-LV ejection fraction 65 to 97%, grade 1 diastolic dysfunction, severe LVH  Labs:   Micro:   Chart Review:       Allergies  Allergen Reactions  . Erythromycin Anaphylaxis  . Amoxicillin Other (See Comments)    Not sure  . Amoxicillin-Pot Clavulanate Other (See Comments)    Not sure  . Prednisone     Elevates bp  . Spiriva Handihaler [Tiotropium Bromide Monohydrate]     Immunization History  Administered Date(s) Administered  . Influenza Split 05/17/2014  . Pneumococcal-Unspecified 09/18/2011  . Tdap 06/04/2012    Past Medical History:  Diagnosis Date  . CHF (congestive heart failure) ([place])   . COPD (chronic obstructive pulmonary disease) ([facility])     Tobacco History: Social History   Tobacco Use  Smoking Status Former Smoker  . Packs/day: 1.00  . Years: 49.00  . Pack years: 49.00  . Types: Cigarettes  . Last attempt to quit: 09/14/2011  . Years since quitting: 6.3  Smokeless Tobacco Never Used   Counseling given: Not Answered   Outpatient Encounter Medications as of 01/19/2018  Medication Sig  . hydrochlorothiazide (HYDRODIURIL) 25 MG tablet Take 25 mg by mouth daily.  Marland Kitchen UNABLE TO FIND [facility]  Oxygen 2lpm 24/7   No facility-administered encounter medications on file as of 01/19/2018.      Review of Systems  Constitutional:   No  weight loss, night sweats,  fevers, chills, fatigue, or  lassitude HEENT:   No headaches,  Difficulty swallowing,  Tooth/dental problems, or  Sore  throat, No sneezing, itching, ear ache, nasal congestion, post nasal drip  CV: No chest pain,  orthopnea, PND, swelling in lower extremities, anasarca, dizziness, palpitations, syncope  GI: No heartburn, indigestion, abdominal pain, nausea, vomiting, diarrhea, change in bowel habits, loss of appetite, bloody  stools Resp: No shortness of breath with exertion or at rest.  No excess mucus, no productive cough,  No non-productive cough,  No coughing up of blood.  No change in color of mucus.  No wheezing.  No chest wall deformity Skin: no rash, lesions, no skin changes. GU: no dysuria, change in color of urine, no urgency or frequency.  No flank pain, no hematuria  MS:  No joint pain or swelling.  No decreased range of motion.  No back pain. Psych:  No change in mood or affect. No depression or anxiety.  No memory loss.   Physical Exam  There were no vitals taken for this visit.  GEN: A/Ox3; pleasant , NAD, well nourished    HEENT:  [place]/AT,  EACs-clear, TMs-wnl, NOSE-clear, THROAT-clear, no lesions, no postnasal drip or exudate noted.   NECK:  Supple w/ fair ROM; no JVD; normal carotid impulses w/o bruits; no thyromegaly or nodules palpated; no lymphadenopathy.    RESP:  Clear  P & A; w/o, wheezes/ rales/ or rhonchi. no accessory muscle use, no dullness to percussion  CARD:  RRR, no m/r/g, no peripheral edema, pulses intact, no cyanosis or clubbing.  GI:   Soft & nt; nml bowel sounds; no organomegaly or masses detected.   Musco: Warm bil, no deformities or joint swelling noted.   Neuro: alert, no focal deficits noted.    Skin: Warm, no lesions or rashes    Lab Results:  CBC    Component Value Date/Time   WBC 8.6 12/11/2015 0220   RBC 5.15 (H) 12/11/2015 0220   HGB 15.1 (H) 12/11/2015 0220   HCT 48.1 (H) 12/11/2015 0220   PLT 220 12/11/2015 0220   MCV 93.4 12/11/2015 0220   MCH 29.3 12/11/2015 0220   MCHC 31.4 12/11/2015 0220   RDW 14.2 12/11/2015 0220   LYMPHSABS 0.6 (L) 09/14/2011 1535   MONOABS 0.7 09/14/2011 1535   EOSABS 0.0 09/14/2011 1535   BASOSABS 0.0 09/14/2011 1535    BMET    Component Value Date/Time   NA 142 12/11/2015 0220   K 5.3 (H) 12/11/2015 0220   CL 104 12/11/2015 0220   CO2 29 12/11/2015 0220   GLUCOSE 107 (H) 12/11/2015 0220   BUN 10 12/11/2015  0220   CREATININE 0.47 12/11/2015 0220   CALCIUM 9.5 12/11/2015 0220   GFRNONAA >60 12/11/2015 0220   GFRAA >60 12/11/2015 0220    BNP No results found for: BNP  ProBNP    Component Value Date/Time   PROBNP 2,123.0 (H) 09/20/2011 0500    Imaging: No results found.   Assessment & Plan:   No problem-specific Assessment & Plan notes found for this encounter.     Lauraine Rinne, NP 01/18/2018

## 2018-01-18 NOTE — Telephone Encounter (Signed)
Spoke with patient-states she has O2 big tanks and concentrator at home. DME: [facility].   Pt has been scheduled with Angel Hinton tomorrow morning at 9:00am for POC walk and OV needed for O2 order. Nothing more needed at this time.

## 2018-01-19 ENCOUNTER — Ambulatory Visit: Payer: Medicare Other | Admitting: Pulmonary Disease

## 2018-05-23 ENCOUNTER — Inpatient Hospital Stay (HOSPITAL_COMMUNITY): Payer: Medicare Other

## 2018-05-23 ENCOUNTER — Other Ambulatory Visit: Payer: Self-pay

## 2018-05-23 ENCOUNTER — Encounter (HOSPITAL_COMMUNITY)
Admission: EM | Disposition: A | Discharge status: Nursing Home (Includes ICF) | Payer: Self-pay | Source: Home / Self Care | Attending: Critical Care Medicine

## 2018-05-23 ENCOUNTER — Encounter (HOSPITAL_COMMUNITY): Payer: Self-pay | Admitting: Emergency Medicine

## 2018-05-23 ENCOUNTER — Inpatient Hospital Stay (HOSPITAL_COMMUNITY)
Admission: EM | Admit: 2018-05-23 | Discharge: 2018-06-02 | DRG: 004 | Disposition: A | Discharge status: Other Acute Inpatient Hospital | Payer: Medicare Other | Attending: Pulmonary Disease | Admitting: Pulmonary Disease

## 2018-05-23 ENCOUNTER — Emergency Department (HOSPITAL_COMMUNITY): Payer: Medicare Other

## 2018-05-23 DIAGNOSIS — J90 Pleural effusion, not elsewhere classified: Secondary | ICD-10-CM | POA: Diagnosis not present

## 2018-05-23 DIAGNOSIS — I110 Hypertensive heart disease with heart failure: Secondary | ICD-10-CM | POA: Diagnosis present

## 2018-05-23 DIAGNOSIS — R627 Adult failure to thrive: Secondary | ICD-10-CM | POA: Diagnosis present

## 2018-05-23 DIAGNOSIS — J9819 Other pulmonary collapse: Secondary | ICD-10-CM | POA: Diagnosis not present

## 2018-05-23 DIAGNOSIS — I2510 Atherosclerotic heart disease of native coronary artery without angina pectoris: Secondary | ICD-10-CM | POA: Diagnosis present

## 2018-05-23 DIAGNOSIS — I469 Cardiac arrest, cause unspecified: Secondary | ICD-10-CM | POA: Diagnosis not present

## 2018-05-23 DIAGNOSIS — F419 Anxiety disorder, unspecified: Secondary | ICD-10-CM | POA: Diagnosis present

## 2018-05-23 DIAGNOSIS — R739 Hyperglycemia, unspecified: Secondary | ICD-10-CM | POA: Diagnosis present

## 2018-05-23 DIAGNOSIS — I213 ST elevation (STEMI) myocardial infarction of unspecified site: Secondary | ICD-10-CM | POA: Insufficient documentation

## 2018-05-23 DIAGNOSIS — Z9289 Personal history of other medical treatment: Secondary | ICD-10-CM

## 2018-05-23 DIAGNOSIS — J939 Pneumothorax, unspecified: Secondary | ICD-10-CM | POA: Diagnosis not present

## 2018-05-23 DIAGNOSIS — J9621 Acute and chronic respiratory failure with hypoxia: Secondary | ICD-10-CM

## 2018-05-23 DIAGNOSIS — I272 Pulmonary hypertension, unspecified: Secondary | ICD-10-CM | POA: Diagnosis present

## 2018-05-23 DIAGNOSIS — J930 Spontaneous tension pneumothorax: Secondary | ICD-10-CM | POA: Diagnosis not present

## 2018-05-23 DIAGNOSIS — Z452 Encounter for adjustment and management of vascular access device: Secondary | ICD-10-CM | POA: Diagnosis not present

## 2018-05-23 DIAGNOSIS — I5181 Takotsubo syndrome: Secondary | ICD-10-CM | POA: Diagnosis present

## 2018-05-23 DIAGNOSIS — Z87891 Personal history of nicotine dependence: Secondary | ICD-10-CM

## 2018-05-23 DIAGNOSIS — Z0189 Encounter for other specified special examinations: Secondary | ICD-10-CM

## 2018-05-23 DIAGNOSIS — I5030 Unspecified diastolic (congestive) heart failure: Secondary | ICD-10-CM | POA: Diagnosis not present

## 2018-05-23 DIAGNOSIS — I9589 Other hypotension: Secondary | ICD-10-CM | POA: Diagnosis present

## 2018-05-23 DIAGNOSIS — E43 Unspecified severe protein-calorie malnutrition: Secondary | ICD-10-CM | POA: Diagnosis present

## 2018-05-23 DIAGNOSIS — Z930 Tracheostomy status: Secondary | ICD-10-CM | POA: Diagnosis not present

## 2018-05-23 DIAGNOSIS — Z515 Encounter for palliative care: Secondary | ICD-10-CM | POA: Diagnosis not present

## 2018-05-23 DIAGNOSIS — Z79899 Other long term (current) drug therapy: Secondary | ICD-10-CM

## 2018-05-23 DIAGNOSIS — R64 Cachexia: Secondary | ICD-10-CM | POA: Diagnosis present

## 2018-05-23 DIAGNOSIS — Z888 Allergy status to other drugs, medicaments and biological substances status: Secondary | ICD-10-CM | POA: Diagnosis not present

## 2018-05-23 DIAGNOSIS — J441 Chronic obstructive pulmonary disease with (acute) exacerbation: Secondary | ICD-10-CM | POA: Diagnosis present

## 2018-05-23 DIAGNOSIS — J439 Emphysema, unspecified: Secondary | ICD-10-CM | POA: Diagnosis not present

## 2018-05-23 DIAGNOSIS — J9690 Respiratory failure, unspecified, unspecified whether with hypoxia or hypercapnia: Secondary | ICD-10-CM | POA: Diagnosis not present

## 2018-05-23 DIAGNOSIS — E876 Hypokalemia: Secondary | ICD-10-CM | POA: Diagnosis present

## 2018-05-23 DIAGNOSIS — Z881 Allergy status to other antibiotic agents status: Secondary | ICD-10-CM | POA: Diagnosis not present

## 2018-05-23 DIAGNOSIS — Z01818 Encounter for other preprocedural examination: Secondary | ICD-10-CM

## 2018-05-23 DIAGNOSIS — I509 Heart failure, unspecified: Secondary | ICD-10-CM | POA: Diagnosis present

## 2018-05-23 DIAGNOSIS — Z681 Body mass index (BMI) 19 or less, adult: Secondary | ICD-10-CM

## 2018-05-23 DIAGNOSIS — Z9981 Dependence on supplemental oxygen: Secondary | ICD-10-CM | POA: Diagnosis not present

## 2018-05-23 DIAGNOSIS — J9620 Acute and chronic respiratory failure, unspecified whether with hypoxia or hypercapnia: Secondary | ICD-10-CM | POA: Diagnosis not present

## 2018-05-23 DIAGNOSIS — Z430 Encounter for attention to tracheostomy: Secondary | ICD-10-CM

## 2018-05-23 DIAGNOSIS — Z8701 Personal history of pneumonia (recurrent): Secondary | ICD-10-CM | POA: Diagnosis not present

## 2018-05-23 DIAGNOSIS — Z9689 Presence of other specified functional implants: Secondary | ICD-10-CM

## 2018-05-23 DIAGNOSIS — I428 Other cardiomyopathies: Secondary | ICD-10-CM | POA: Diagnosis present

## 2018-05-23 DIAGNOSIS — E8770 Fluid overload, unspecified: Secondary | ICD-10-CM | POA: Diagnosis not present

## 2018-05-23 DIAGNOSIS — R579 Shock, unspecified: Secondary | ICD-10-CM | POA: Diagnosis not present

## 2018-05-23 DIAGNOSIS — I471 Supraventricular tachycardia: Secondary | ICD-10-CM | POA: Diagnosis present

## 2018-05-23 DIAGNOSIS — J690 Pneumonitis due to inhalation of food and vomit: Secondary | ICD-10-CM | POA: Diagnosis present

## 2018-05-23 DIAGNOSIS — J810 Acute pulmonary edema: Secondary | ICD-10-CM | POA: Diagnosis not present

## 2018-05-23 DIAGNOSIS — Z7189 Other specified counseling: Secondary | ICD-10-CM | POA: Diagnosis not present

## 2018-05-23 DIAGNOSIS — Z4682 Encounter for fitting and adjustment of non-vascular catheter: Secondary | ICD-10-CM | POA: Diagnosis not present

## 2018-05-23 DIAGNOSIS — I2119 ST elevation (STEMI) myocardial infarction involving other coronary artery of inferior wall: Secondary | ICD-10-CM | POA: Diagnosis present

## 2018-05-23 DIAGNOSIS — J9600 Acute respiratory failure, unspecified whether with hypoxia or hypercapnia: Secondary | ICD-10-CM | POA: Diagnosis present

## 2018-05-23 LAB — COMPREHENSIVE METABOLIC PANEL
ALBUMIN: 3.5 g/dL (ref 3.5–5.0)
ALT: 15 U/L (ref 0–44)
AST: 21 U/L (ref 15–41)
Alkaline Phosphatase: 55 U/L (ref 38–126)
Anion gap: 11 (ref 5–15)
BILIRUBIN TOTAL: 0.8 mg/dL (ref 0.3–1.2)
BUN: 22 mg/dL (ref 8–23)
CALCIUM: 8.9 mg/dL (ref 8.9–10.3)
CO2: 31 mmol/L (ref 22–32)
Chloride: 96 mmol/L — ABNORMAL LOW (ref 98–111)
Creatinine, Ser: 0.68 mg/dL (ref 0.44–1.00)
GFR calc Af Amer: >60 mL/min (ref >60)
GFR calc non Af Amer: >60 mL/min (ref >60)
GLUCOSE: 276 mg/dL — AB (ref 70–99)
Potassium: 2.8 mmol/L — ABNORMAL LOW (ref 3.5–5.1)
SODIUM: 138 mmol/L (ref 135–145)
TOTAL PROTEIN: 6.4 g/dL — AB (ref 6.5–8.1)

## 2018-05-23 LAB — URINALYSIS, ROUTINE W REFLEX MICROSCOPIC
Bilirubin Urine: NEGATIVE
GLUCOSE, UA: 150 mg/dL — AB
HGB URINE DIPSTICK: NEGATIVE
Ketones, ur: NEGATIVE mg/dL
LEUKOCYTES UA: NEGATIVE
NITRITE: NEGATIVE
PROTEIN: 100 mg/dL — AB
Specific Gravity, Urine: 1.018 (ref 1.005–1.030)
pH: 5 (ref 5.0–8.0)

## 2018-05-23 LAB — I-STAT ARTERIAL BLOOD GAS, ED
Acid-Base Excess: 4 mmol/L — ABNORMAL HIGH (ref 0.0–2.0)
Bicarbonate: 31.6 mmol/L — ABNORMAL HIGH (ref 20.0–28.0)
O2 Saturation: 99 %
PCO2 ART: 60.8 mmHg — AB (ref 32.0–48.0)
PH ART: 7.319 — AB (ref 7.350–7.450)
TCO2: 33 mmol/L — AB (ref 22–32)
pO2, Arterial: 135 mmHg — ABNORMAL HIGH (ref 83.0–108.0)

## 2018-05-23 LAB — HEMOGLOBIN A1C
Hgb A1c MFr Bld: 5.7 % — ABNORMAL HIGH (ref 4.8–5.6)
MEAN PLASMA GLUCOSE: 116.89 mg/dL

## 2018-05-23 LAB — PROTIME-INR
INR: 0.92
PROTHROMBIN TIME: 12.3 s (ref 11.4–15.2)

## 2018-05-23 LAB — CBC WITH DIFFERENTIAL/PLATELET
Abs Immature Granulocytes: 0.1 10*3/uL (ref 0.0–0.1)
BASOS ABS: 0.1 10*3/uL (ref 0.0–0.1)
Basophils Relative: 0 %
EOS PCT: 2 %
Eosinophils Absolute: 0.2 10*3/uL (ref 0.0–0.7)
HEMATOCRIT: 46.2 % — AB (ref 36.0–46.0)
HEMOGLOBIN: 13.7 g/dL (ref 12.0–15.0)
Immature Granulocytes: 1 %
LYMPHS PCT: 29 %
Lymphs Abs: 4.8 10*3/uL — ABNORMAL HIGH (ref 0.7–4.0)
MCH: 29.1 pg (ref 26.0–34.0)
MCHC: 29.7 g/dL — AB (ref 30.0–36.0)
MCV: 98.3 fL (ref 78.0–100.0)
MONO ABS: 1.3 10*3/uL — AB (ref 0.1–1.0)
Monocytes Relative: 8 %
Neutro Abs: 10.1 10*3/uL — ABNORMAL HIGH (ref 1.7–7.7)
Neutrophils Relative %: 60 %
Platelets: 279 10*3/uL (ref 150–400)
RBC: 4.7 MIL/uL (ref 3.87–5.11)
RDW: 13.2 % (ref 11.5–15.5)
WBC: 16.5 10*3/uL — ABNORMAL HIGH (ref 4.0–10.5)

## 2018-05-23 LAB — ECHOCARDIOGRAM COMPLETE: Weight: 1400.36 oz

## 2018-05-23 LAB — TROPONIN I
Troponin I: 0.09 ng/mL (ref <0.03)
Troponin I: 1.43 ng/mL (ref <0.03)
Troponin I: 2.95 ng/mL (ref <0.03)
Troponin I: 4.32 ng/mL (ref <0.03)

## 2018-05-23 LAB — LIPID PANEL
Cholesterol: 225 mg/dL — ABNORMAL HIGH (ref 0–200)
HDL: 80 mg/dL (ref >40)
LDL CALC: 122 mg/dL — AB (ref 0–99)
Total CHOL/HDL Ratio: 2.8 RATIO
Triglycerides: 114 mg/dL (ref <150)
VLDL: 23 mg/dL (ref 0–40)

## 2018-05-23 LAB — GLUCOSE, CAPILLARY
GLUCOSE-CAPILLARY: 171 mg/dL — AB (ref 70–99)
Glucose-Capillary: 167 mg/dL — ABNORMAL HIGH (ref 70–99)
Glucose-Capillary: 112 mg/dL — ABNORMAL HIGH (ref 70–99)
Glucose-Capillary: 102 mg/dL — ABNORMAL HIGH (ref 70–99)

## 2018-05-23 LAB — PROCALCITONIN

## 2018-05-23 LAB — LACTIC ACID, PLASMA
LACTIC ACID, VENOUS: 4.8 mmol/L — AB (ref 0.5–1.9)
Lactic Acid, Venous: 4.1 mmol/L (ref 0.5–1.9)

## 2018-05-23 LAB — HEPARIN LEVEL (UNFRACTIONATED)
HEPARIN UNFRACTIONATED: 0.13 [IU]/mL — AB (ref 0.30–0.70)
Heparin Unfractionated: 0.4 IU/mL (ref 0.30–0.70)

## 2018-05-23 LAB — MRSA PCR SCREENING: MRSA BY PCR: NEGATIVE

## 2018-05-23 LAB — MAGNESIUM: MAGNESIUM: 3.7 mg/dL — AB (ref 1.7–2.4)

## 2018-05-23 LAB — APTT: aPTT: 20 seconds — ABNORMAL LOW (ref 24–36)

## 2018-05-23 SURGERY — CORONARY/GRAFT ACUTE MI REVASCULARIZATION
Anesthesia: LOCAL

## 2018-05-23 MED ORDER — POTASSIUM CHLORIDE 2 MEQ/ML IV SOLN
INTRAVENOUS | Status: DC
  Administered 2018-05-23 – 2018-05-24 (×2): via INTRAVENOUS
  Filled 2018-05-23 (×4): qty 1000

## 2018-05-23 MED ORDER — MIDAZOLAM HCL 2 MG/2ML IJ SOLN
INTRAMUSCULAR | Status: AC
  Filled 2018-05-23: qty 2

## 2018-05-23 MED ORDER — FENTANYL BOLUS VIA INFUSION
25.0000 ug | INTRAVENOUS | Status: DC | PRN
  Administered 2018-05-27 – 2018-05-30 (×5): 25 ug via INTRAVENOUS
  Filled 2018-05-23: qty 25

## 2018-05-23 MED ORDER — ALBUTEROL SULFATE (2.5 MG/3ML) 0.083% IN NEBU
2.5000 mg | INHALATION_SOLUTION | RESPIRATORY_TRACT | Status: DC
  Administered 2018-05-23 – 2018-05-24 (×8): 2.5 mg via RESPIRATORY_TRACT
  Filled 2018-05-23 (×8): qty 3

## 2018-05-23 MED ORDER — ASPIRIN 81 MG PO CHEW
81.0000 mg | CHEWABLE_TABLET | Freq: Every day | ORAL | Status: DC
  Administered 2018-05-24 – 2018-06-02 (×10): 81 mg
  Filled 2018-05-23 (×10): qty 1

## 2018-05-23 MED ORDER — INFLUENZA VAC SPLIT HIGH-DOSE 0.5 ML IM SUSY
0.5000 mL | PREFILLED_SYRINGE | INTRAMUSCULAR | Status: DC
  Filled 2018-05-23: qty 0.5

## 2018-05-23 MED ORDER — FENTANYL CITRATE (PF) 100 MCG/2ML IJ SOLN
50.0000 ug | Freq: Once | INTRAMUSCULAR | Status: AC
  Administered 2018-05-23: 50 ug via INTRAVENOUS

## 2018-05-23 MED ORDER — POTASSIUM CHLORIDE 10 MEQ/100ML IV SOLN
10.0000 meq | INTRAVENOUS | Status: AC
  Administered 2018-05-23 (×3): 10 meq via INTRAVENOUS
  Filled 2018-05-23 (×3): qty 100

## 2018-05-23 MED ORDER — ATORVASTATIN CALCIUM 40 MG PO TABS
40.0000 mg | ORAL_TABLET | Freq: Every day | ORAL | Status: DC
  Administered 2018-05-23: 40 mg via ORAL
  Filled 2018-05-23: qty 1

## 2018-05-23 MED ORDER — ETOMIDATE 2 MG/ML IV SOLN
INTRAVENOUS | Status: AC | PRN
  Administered 2018-05-23: 20 mg via INTRAVENOUS

## 2018-05-23 MED ORDER — MIDAZOLAM HCL 2 MG/2ML IJ SOLN
2.0000 mg | INTRAMUSCULAR | Status: DC | PRN
  Filled 2018-05-23: qty 2

## 2018-05-23 MED ORDER — MIDAZOLAM HCL 2 MG/2ML IJ SOLN
2.0000 mg | Freq: Once | INTRAMUSCULAR | Status: AC
  Administered 2018-05-23: 2 mg via INTRAVENOUS

## 2018-05-23 MED ORDER — HEPARIN (PORCINE) IN NACL 100-0.45 UNIT/ML-% IJ SOLN
650.0000 [IU]/h | INTRAMUSCULAR | Status: DC
  Administered 2018-05-23: 500 [IU]/h via INTRAVENOUS
  Administered 2018-05-24: 650 [IU]/h via INTRAVENOUS
  Filled 2018-05-23 (×2): qty 250

## 2018-05-23 MED ORDER — MIDAZOLAM HCL 2 MG/2ML IJ SOLN: 2.0000 mg | INTRAMUSCULAR | Status: DC | PRN

## 2018-05-23 MED ORDER — ORAL CARE MOUTH RINSE
15.0000 mL | OROMUCOSAL | Status: DC
  Administered 2018-05-23 – 2018-06-02 (×104): 15 mL via OROMUCOSAL

## 2018-05-23 MED ORDER — ASPIRIN 300 MG RE SUPP
300.0000 mg | Freq: Once | RECTAL | Status: AC
  Administered 2018-05-23: 300 mg via RECTAL

## 2018-05-23 MED ORDER — PROPOFOL 1000 MG/100ML IV EMUL
5.0000 ug/kg/min | Freq: Once | INTRAVENOUS | Status: AC
  Administered 2018-05-23: 10 ug/kg/min via INTRAVENOUS

## 2018-05-23 MED ORDER — SODIUM CHLORIDE 0.9 % IV BOLUS
500.0000 mL | Freq: Once | INTRAVENOUS | Status: AC
  Administered 2018-05-23: 500 mL via INTRAVENOUS

## 2018-05-23 MED ORDER — SUCCINYLCHOLINE CHLORIDE 20 MG/ML IJ SOLN
INTRAMUSCULAR | Status: AC | PRN
  Administered 2018-05-23: 100 mg via INTRAVENOUS

## 2018-05-23 MED ORDER — SODIUM CHLORIDE 0.9 % IV SOLN
500.0000 mL | Freq: Once | INTRAVENOUS | Status: AC
  Administered 2018-05-23: 500 mL via INTRAVENOUS

## 2018-05-23 MED ORDER — FENTANYL 2500MCG IN NS 250ML (10MCG/ML) PREMIX INFUSION
25.0000 ug/h | INTRAVENOUS | Status: DC
  Administered 2018-05-23: 50 ug/h via INTRAVENOUS
  Administered 2018-05-23: 100 ug/h via INTRAVENOUS
  Administered 2018-05-26: 50 ug/h via INTRAVENOUS
  Administered 2018-05-28: 75 ug/h via INTRAVENOUS
  Administered 2018-05-30 – 2018-06-02 (×3): 50 ug/h via INTRAVENOUS
  Filled 2018-05-23 (×7): qty 250

## 2018-05-23 MED ORDER — HEPARIN SODIUM (PORCINE) 5000 UNIT/ML IJ SOLN
60.0000 [IU]/kg | Freq: Once | INTRAMUSCULAR | Status: AC
  Administered 2018-05-23: 2300 [IU] via INTRAVENOUS

## 2018-05-23 MED ORDER — PROPOFOL 1000 MG/100ML IV EMUL
INTRAVENOUS | Status: AC
  Filled 2018-05-23: qty 100

## 2018-05-23 MED ORDER — SODIUM CHLORIDE 0.9 % IV SOLN
INTRAVENOUS | Status: DC
  Administered 2018-05-23 – 2018-05-29 (×6): via INTRAVENOUS

## 2018-05-23 MED ORDER — PANTOPRAZOLE SODIUM 40 MG PO PACK
40.0000 mg | PACK | Freq: Every day | ORAL | Status: DC
  Administered 2018-05-23 – 2018-06-02 (×11): 40 mg
  Filled 2018-05-23 (×11): qty 20

## 2018-05-23 MED ORDER — CHLORHEXIDINE GLUCONATE 0.12% ORAL RINSE (MEDLINE KIT)
15.0000 mL | Freq: Two times a day (BID) | OROMUCOSAL | Status: DC
  Administered 2018-05-23 – 2018-06-02 (×22): 15 mL via OROMUCOSAL

## 2018-05-23 MED ORDER — INSULIN ASPART 100 UNIT/ML ~~LOC~~ SOLN
0.0000 [IU] | SUBCUTANEOUS | Status: DC
  Administered 2018-05-23 (×2): 3 [IU] via SUBCUTANEOUS
  Administered 2018-05-24 (×2): 5 [IU] via SUBCUTANEOUS
  Administered 2018-05-24 – 2018-05-30 (×13): 2 [IU] via SUBCUTANEOUS
  Administered 2018-05-30: 3 [IU] via SUBCUTANEOUS
  Administered 2018-05-31: 2 [IU] via SUBCUTANEOUS
  Administered 2018-05-31: 3 [IU] via SUBCUTANEOUS
  Administered 2018-05-31 – 2018-06-01 (×2): 2 [IU] via SUBCUTANEOUS
  Administered 2018-06-01: 3 [IU] via SUBCUTANEOUS
  Administered 2018-06-01 – 2018-06-02 (×6): 2 [IU] via SUBCUTANEOUS
  Administered 2018-06-02: 3 [IU] via SUBCUTANEOUS

## 2018-05-23 MED ORDER — FENTANYL CITRATE (PF) 100 MCG/2ML IJ SOLN
INTRAMUSCULAR | Status: AC
  Filled 2018-05-23: qty 2

## 2018-05-23 MED ORDER — POTASSIUM CHLORIDE 20 MEQ/15ML (10%) PO SOLN
40.0000 meq | Freq: Once | ORAL | Status: AC
  Administered 2018-05-23: 40 meq
  Filled 2018-05-23: qty 30

## 2018-05-23 NOTE — ED Provider Notes (Signed)
[facility] EMERGENCY DEPARTMENT Provider Note   CSN: [id number] Arrival date & time: 05/23/18  0047     History   Chief Complaint Chief Complaint  Patient presents with  . Code STEMI    HPI Angel Hinton is a 77 y.o. female.  The history is provided by the patient and the EMS personnel.  She has a history of severe COPD and called her caregiver about 1 hour prior to arrival in the ED because of difficulty breathing.  Caregiver called for EMS.  EMS has given the patient several nebulizer treatments with albuterol and noted ECG changes concerning for STEMI so code STEMI was activated.  She has not received aspirin and nitroglycerin because of not complaining of chest pain.  She is complaining of a discomfort in her chest which she cannot describe.  Denies nausea or diaphoresis.  EMS reports oxygen saturation is fairly consistently in the mid 80s en route.  Past Medical History:  Diagnosis Date  . CHF (congestive heart failure) ([place])   . COPD (chronic obstructive pulmonary disease) [facility])     Patient Active Problem List   Diagnosis Date Noted  . Abnormal CT scan of lung 02/20/2015  . Chronic respiratory failure ([place]) 02/19/2015  . COPD GOLD C-D 10/26/2011  . Cor pulmonale, chronic ([place]) 09/21/2011  . Respiratory failure, acute-on-chronic ([place]) 09/19/2011  . CHF (congestive heart failure) ([place]) 09/14/2011  . Anasarca 09/14/2011  . Polycythemia 09/14/2011  . Smoker 09/14/2011    History reviewed. No pertinent surgical history.   OB History   None      Home Medications    Prior to Admission medications   Medication Sig Start Date End Date Taking? Authorizing Provider  hydrochlorothiazide (HYDRODIURIL) 25 MG tablet Take 25 mg by mouth daily.    [provider]  UNABLE TO FIND Med Name: DME- [facility]  Oxygen 2lpm 24/7    [provider]    Family History Family History  Problem Relation Age of Onset  . Hypertension Father   .  Hypertension Sister   . Diabetes Sister     Social History Social History   Tobacco Use  . Smoking status: Former Smoker    Packs/day: 1.00    Years: 49.00    Pack years: 49.00    Types: Cigarettes    Last attempt to quit: 09/14/2011    Years since quitting: 6.6  . Smokeless tobacco: Never Used  Substance Use Topics  . Alcohol use: No  . Drug use: No     Allergies   Erythromycin; Amoxicillin; Amoxicillin-pot clavulanate; Prednisone; and Spiriva handihaler [tiotropium bromide monohydrate]   Review of Systems Review of Systems  Unable to perform ROS: Acuity of condition     Physical Exam Updated Vital Signs BP (!) 169/77   Pulse (!) 119   Temp (!) 96.4 F (35.8 C) (Temporal)   Resp 17   Wt 38.6 kg   SpO2 97%   BMI 14.14 kg/m   Physical Exam  Nursing note and vitals reviewed.  Frail appealing 77 year old female, resting comfortably and in no acute distress. Vital signs are significant for elevated heart rate and elevated systolic blood pressure. Oxygen saturation is 88%, which is hypoxic. Head is normocephalic and atraumatic. PERRLA, EOMI. Oropharynx is clear. Neck is nontender and supple without adenopathy or JVD. Back is nontender and there is no CVA tenderness. Lungs have markedly diminished airflow.  No rales, wheezes, rhonchi appreciated. Chest is nontender. Heart has regular rate  and rhythm without murmur. Abdomen is soft, flat, nontender without masses or hepatosplenomegaly and peristalsis is normoactive. Extremities have no cyanosis or edema, full range of motion is present. Skin is warm and dry without rash.  She is somewhat pale. Neurologic: Awake and alert but somewhat anxious, cranial nerves are intact, there are no motor or sensory deficits.  ED Treatments / Results  Labs (all labs ordered are listed, but only abnormal results are displayed) Labs Reviewed  CBC WITH DIFFERENTIAL/PLATELET - Abnormal; Notable for the following components:       Result Value   WBC 16.5 (*)    HCT 46.2 (*)    MCHC 29.7 (*)    Neutro Abs 10.1 (*)    Lymphs Abs 4.8 (*)    Monocytes Absolute 1.3 (*)    All other components within normal limits  APTT - Abnormal; Notable for the following components:   aPTT 20 (*)    All other components within normal limits  PROTIME-INR  COMPREHENSIVE METABOLIC PANEL  TROPONIN I  LIPID PANEL  HEPARIN LEVEL (UNFRACTIONATED)  I-STAT ARTERIAL BLOOD GAS, ED    EKG EKG Interpretation  Date/Time:  Monday May 23 2018 00:50:53 EDT Ventricular Rate:  101 PR Interval:    QRS Duration: 123 QT Interval:  400 QTC Calculation: 519 R Axis:   -72 Text Interpretation:  Sinus tachycardia Atrial premature complex Nonspecific IVCD with LAD Left ventricular hypertrophy Inferior infarct, acute Anterior infarct, acute (LAD) Artifact in lead(s) I III aVL V2 V3 V4 V5 >>> Acute MI <<< When compared with ECG of 12/11/2015, ST elevation in Inferior leads is now present Confirmed by Delora Fuel ([id number]) on 05/23/2018 1:32:59 AM   EKG Interpretation  Date/Time:  Monday May 23 2018 01:00:54 EDT Ventricular Rate:  130 PR Interval:    QRS Duration: 122 QT Interval:  396 QTC Calculation: 583 R Axis:   -83 Text Interpretation:  Sinus or ectopic atrial tachycardia Atrial premature complex IVCD, consider atypical RBBB Left ventricular hypertrophy Inferior infarct, acute Anterior infarct, acute (LAD) >>> Acute MI <<< When compared with ECG of EARLIER SAME DATE No significant change was found Less artifact present Confirmed by Delora Fuel ([id number]) on 05/23/2018 1:34:30 AM       EKG Interpretation  Date/Time:  Monday May 23 2018 01:11:44 EDT Ventricular Rate:  116 PR Interval:    QRS Duration: 126 QT Interval:  467 QTC Calculation: 649 R Axis:   -86 Text Interpretation:  Sinus or ectopic atrial tachycardia Nonspecific IVCD with LAD Left ventricular hypertrophy Inferior infarct, acute Anterior infarct, acute (LAD) >>> Acute  MI <<< When compared with ECG of EARLIER SAME DATE No significant change was found Confirmed by Delora Fuel ([id number]) on 05/23/2018 1:35:54 AM      Radiology Dg Chest Portable 1 View  Result Date: 05/23/2018 CLINICAL DATA:  Chest tube placement EXAM: PORTABLE CHEST 1 VIEW COMPARISON:  Earlier today FINDINGS: Interval placement of chest tube with loop along the upper midline. The pneumothorax has significantly decompressed, but is still at least 25%. Endotracheal tube tip just below the clavicular heads. Orogastric tube and side-port continues to terminate in the lower esophagus. COPD.  Normal heart size. IMPRESSION: Decreased pneumothorax after chest tube placement. Residual gas is at least 25%. Orogastric tube tip and side-port continues to terminate in the lower esophagus. Electronically Signed   By: Monte Fantasia M.D.   On: 05/23/2018 01:31   Dg Chest Portable 1 View  Result Date: 05/23/2018 CLINICAL DATA:  History  elevation of my. EXAM: PORTABLE CHEST 1 VIEW COMPARISON:  CT 02/05/2015 FINDINGS: Endotracheal tube 4.6 cm from the carina. Enteric tube in place with tip in the distal esophagus, side-port in the mid esophagus. Large right pneumothorax, greater than 50%. No mediastinal shift. Chronic hyperinflation and bronchial thickening. Patchy left lower lobe opacity. Bones diffusely under mineralized. IMPRESSION: 1. Large right pneumothorax without mediastinal shift. 2. Endotracheal tube 4.6 cm from the carina. The side port of the enteric tube is in the mid esophagus, recommend advancement of approximately 15 cm. 3. Hyperinflation and bronchial thickening, imaging findings consistent with COPD. 4. Patchy left basilar opacity may be atelectasis, pneumonia or aspiration. Critical Value/emergent results were called by telephone at the time of interpretation on 05/23/2018 at 1:16 am to Dr. Delora Fuel , who was putting in a chest tube at the time of the call. Electronically Signed   By: Keith Rake  M.D.   On: 05/23/2018 01:17    Procedures Procedure Name: Intubation Date/Time: 05/23/2018 1:10 AM Performed by: Delora Fuel, MD Pre-anesthesia Checklist: Patient identified, Patient being monitored, Emergency Drugs available, Timeout performed and Suction available Oxygen Delivery Method: Non-rebreather mask Preoxygenation: Pre-oxygenation with 100% oxygen Induction Type: Rapid sequence Ventilation: Mask ventilation without difficulty Laryngoscope Size: Glidescope and 3 Grade View: Grade I Tube size: 7.5 mm Number of attempts: 1 Airway Equipment and Method: Video-laryngoscopy and Rigid stylet Placement Confirmation: ETT inserted through vocal cords under direct vision,  CO2 detector and Breath sounds checked- equal and bilateral Secured at: 23 cm Tube secured with: ETT holder Dental Injury: Teeth and Oropharynx as per pre-operative assessment      CHEST TUBE INSERTION Date/Time: 05/23/2018 1:14 AM Performed by: Delora Fuel, MD Authorized by: Delora Fuel, MD   Consent:    Consent obtained:  Emergent situation   Consent given by: Implied consent. Pre-procedure details:    Skin preparation:  ChloraPrep   Preparation: Patient was prepped and draped in the usual sterile fashion   Sedation:    Sedation type:  Deep (Patient already under deep sedation related to intubation) Anesthesia (see MAR for exact dosages):    Anesthesia method:  None Procedure details:    Placement location:  R lateral   Tube size [place]): Pigtail catheter.   Ultrasound guidance: no     Tension pneumothorax: no     Tube connected to:  Suction   Drainage characteristics:  Air only   Suture material:  2-0 silk   Dressing:  4x4 sterile gauze Post-procedure details:    Post-insertion x-ray findings: tube in good position     Patient tolerance of procedure:  Tolerated well, no immediate complications    CRITICAL CARE Performed by: Delora Fuel Total critical care time: 130 minutes Critical care  time was exclusive of separately billable procedures and treating other patients. Critical care was necessary to treat or prevent imminent or life-threatening deterioration. Critical care was time spent personally by me on the following activities: development of treatment plan with patient and/or surrogate as well as nursing, discussions with consultants, evaluation of patient's response to treatment, examination of patient, obtaining history from patient or surrogate, ordering and performing treatments and interventions, ordering and review of laboratory studies, ordering and review of radiographic studies, pulse oximetry and re-evaluation of patient's condition.  Medications Ordered in ED Medications  heparin ADULT infusion 100 units/mL (25000 units/2101mL sodium chloride 0.45%) (500 Units/hr Intravenous New Bag/Given 05/23/18 0204)  potassium chloride 10 mEq in 100 mL IVPB (has no administration in time  range)  heparin injection 2,300 Units (2,300 Units Intravenous Given 05/23/18 0105)  etomidate (AMIDATE) injection (20 mg Intravenous Given 05/23/18 0059)  succinylcholine (ANECTINE) injection (100 mg Intravenous Given 05/23/18 0059)  aspirin suppository 300 mg (300 mg Rectal Given 05/23/18 0111)  propofol (DIPRIVAN) 1000 MG/100ML infusion (10 mcg/kg/min  38.6 kg Intravenous New Bag/Given 05/23/18 0139)  midazolam (VERSED) injection 2 mg (2 mg Intravenous Given 05/23/18 0133)  fentaNYL (SUBLIMAZE) injection 50 mcg (50 mcg Intravenous Given 05/23/18 0133)     Initial Impression / Assessment and Plan / ED Course  I have reviewed the triage vital signs and the nursing notes.  Pertinent labs & imaging results that were available during my care of the patient were reviewed by me and considered in my medical decision making (see chart for details).  Patient presenting as a possible code STEMI.  Dr. Hassell Done of cardiology service was here when patient arrived, and Dr. Angelena Form, on-call STEMI physician  arrived shortly after patient arrived.  ECG was repeated in the ED and does appear to show ST elevation in inferior and anterior leads.  However, picture was felt to be atypical and patient was not felt to be a good candidate for revascularization.  Decision was made to not take her to the Cath Lab.  Her prior records were reviewed, and apparently she has been followed for her COPD and has refused numerous different treatments.  However, on arrival here, patient stated that she wanted all possible things done to treat her.  Patient started to get more anxious appearing and oxygen saturation is dropped to down to 50% range.  She was placed on a nonrebreather mask, and sent oxygen saturations came up to 90%, but she still was very anxious and started to get diaphoretic.  She intubated at this point.  She was also given intravenous heparin and rectal aspirin to treat possible ACS.  Portable chest x-ray following placement of endotracheal tube showed moderate to large right pneumothorax.  Pigtail chest tube catheter was placed, and repeat x-ray shows significant expansion of the lung.  Patient has remained hemodynamically stable following above-noted treatments.  Potassium is come back very low at 2.8, and she is started on intravenous potassium.  Magnesium level will be checked.  Troponin has come back mildly elevated at 0.09, and this will need to be trended.  Repeat ECG continues to show evidence of STEMI.  Q waves are present suggesting this actually may be a completed infarct.  Case is discussed with Dr. Oletta Darter of critical care service who agrees to admit the patient.  Final Clinical Impressions(s) / ED Diagnoses   Final diagnoses:  Acute and chronic respiratory failure with hypoxia (HCC)  Pneumothorax on right  ST elevation myocardial infarction (STEMI), unspecified artery ([place])  Hypokalemia  Hyperglycemia    ED Discharge Orders    None       Delora Fuel, MD 48/25/00 [phone]

## 2018-05-23 NOTE — Plan of Care (Signed)
Pt remains sedated on vent, turn q2h; family at bedside and educated.

## 2018-05-23 NOTE — Progress Notes (Signed)
Exie Parody, caregiver/lawncare guy called hospital and wanted for doctors and staff to be aware that patient fell a week ago and he also wanted her to be tested for a UTI. RN will pass the information along. RN will continue to monitor.

## 2018-05-23 NOTE — Progress Notes (Signed)
Initial Nutrition Assessment  DOCUMENTATION CODES:   Underweight  INTERVENTION:   If TF started:   Rec initiation of Vital AF 1.2 at 15 ml/hr and increase by 10 ml every 12 hours to goal rate of 45 ml/hr  Provides 1296 kcals, 81 gm protein, 875 ml of free water   Monitor magnesium, potassium, and phosphorus daily for at least 3 days, MD to replete as needed, as pt is at risk for refeeding syndrome with suspected malnutrition  NUTRITION DIAGNOSIS:   Inadequate oral intake related to inability to eat as evidenced by NPO status   GOAL:   Patient will meet greater than or equal to 90% of their needs  MONITOR:   Vent status, Labs, Skin, Weight trends, I & O's  REASON FOR ASSESSMENT:   Ventilator  ASSESSMENT:   77 yo Female with advanced COPD who presented with acute dyspnea and desaturation was found to have a right tension pneumothorax.  Patient is currently intubated on ventilator support MV: 5.3 L/min Temp (24hrs), Avg:99.3 F (37.4 C), Min:96.4 F (35.8 C), Max:100.4 F (38 C)  OGT in place  RD unable to obtain nutrition hx from patient at this time. She was found to have EKG changes consitent with acute MI. Labs reviewed. Mg 3.7 (H). K 2.8 (H). Medications include Fentanyl. CBG's [id number].  NUTRITION - FOCUSED PHYSICAL EXAM:  Unable to complete at this time, however, suspect malnutrition.  Diet Order:   Diet Order    None     EDUCATION NEEDS:   Not appropriate for education at this time  Skin:  Skin Assessment: Reviewed RN Assessment  Last BM:  PTA    Intake/Output Summary (Last 24 hours) at 05/23/2018 1652 Last data filed at 05/23/2018 1600 Gross per 24 hour  Intake 1746.07 ml  Output 467 ml  Net 1279.07 ml   Height:   Ht Readings from Last 1 Encounters:  05/23/18 5\' 5"  (1.651 m)   Weight:   Wt Readings from Last 1 Encounters:  05/23/18 39.7 kg   Ideal Body Weight:  56.8 kg  BMI:  Body mass index is 14.56 kg/m.  Estimated  Nutritional Needs:   Kcal:  1316  Protein:  75-90 gm  Fluid:  per MD  Arthur Holms, RD, LDN Pager #: [phone] After-Hours Pager #: [phone]

## 2018-05-23 NOTE — Progress Notes (Signed)
ANTICOAGULATION CONSULT NOTE - Follow Up Consult  Pharmacy Consult for Heparin Indication: chest pain/ACS  Allergies  Allergen Reactions  . Erythromycin Anaphylaxis  . Amoxicillin Other (See Comments)    Not sure  . Amoxicillin-Pot Clavulanate Other (See Comments)    Not sure  . Prednisone     Elevates bp  . Spiriva Handihaler [Tiotropium Bromide Monohydrate]     Patient Measurements: Height: 5\' 5"  (165.1 cm) Weight: 87 lb 8.4 oz (39.7 kg) IBW/kg (Calculated) : 57 Heparin Dosing Weight: 39.7 kg  Vital Signs: Temp: 100.2 F (37.9 C) (10/07 2050) BP: 102/56 (10/07 2050) Pulse Rate: 78 (10/07 2050)  Labs: Recent Labs    05/23/18 0107 05/23/18 0713 05/23/18 0919 05/23/18 1238 05/23/18 1908  HGB 13.7  --   --   --   --   HCT 46.2*  --   --   --   --   PLT 279  --   --   --   --   APTT 20*  --   --   --   --   LABPROT 12.3  --   --   --   --   INR 0.92  --   --   --   --   HEPARINUNFRC  --   --  0.13*  --  0.40  CREATININE 0.68  --   --   --   --   TROPONINI 0.09* 1.43*  --  2.95*  --     Estimated Creatinine Clearance: 38.1 mL/min (by C-G formula based on SCr of 0.68 mg/dL).   Assessment: 77 yo F on heparin for chest pain/ACS. Initially presented with SOB requiring intubation and found to have R sided PTX requiring chest tube insertion. Not a candidate for ACS intervention at this time due to current clinical status.  HL (0.13) subtherapeutic on 500 u/hr and 2300 unit loading dose. CBC ok on admission (HCT slightly elevated 46.2%). No signs of bleeding or issues with infusion per nursing. Will increase heparin rate and continue to monitor levels and signs of bleeding.  PM: Heparin therapeutic this PM. We will confirm level in AM.   Goal of Therapy:  Heparin level 0.3-0.7 units/ml Monitor platelets by anticoagulation protocol: Yes   Plan:  - Cont heparin to 650 units/hr - Continue to monitor daily H&H, PLTs, and signs of bleeding  Onnie Boer, PharmD, Jake Samples,  AAHIVP, CPP Infectious Disease Pharmacist Pager: [phone] 05/23/2018 9:35 PM

## 2018-05-23 NOTE — Progress Notes (Signed)
ANTICOAGULATION CONSULT NOTE - Follow Up Consult  Pharmacy Consult for Heparin Indication: chest pain/ACS  Allergies  Allergen Reactions  . Erythromycin Anaphylaxis  . Amoxicillin Other (See Comments)    Not sure  . Amoxicillin-Pot Clavulanate Other (See Comments)    Not sure  . Prednisone     Elevates bp  . Spiriva Handihaler [Tiotropium Bromide Monohydrate]     Patient Measurements: Weight: 87 lb 8.4 oz (39.7 kg) Heparin Dosing Weight: 39.7 kg  Vital Signs: Temp: 99.5 F (37.5 C) (10/07 1000) Temp Source: Bladder (10/07 0545) BP: 93/58 (10/07 1000) Pulse Rate: 79 (10/07 1000)  Labs: Recent Labs    05/23/18 0107 05/23/18 0713 05/23/18 0919  HGB 13.7  --   --   HCT 46.2*  --   --   PLT 279  --   --   APTT 20*  --   --   LABPROT 12.3  --   --   INR 0.92  --   --   HEPARINUNFRC  --   --  0.13*  CREATININE 0.68  --   --   TROPONINI 0.09* 1.43*  --     CrCl cannot be calculated (Unknown ideal weight.).   Assessment: 77 yo F on heparin for chest pain/ACS. Initially presented with SOB requiring intubation and found to have R sided PTX requiring chest tube insertion. Not a candidate for ACS intervention at this time due to current clinical status.  HL (0.13) subtherapeutic on 500 u/hr and 2300 unit loading dose. CBC ok on admission (HCT slightly elevated 46.2%). No signs of bleeding or issues with infusion per nursing. Will increase heparin rate and continue to monitor levels and signs of bleeding.  Goal of Therapy:  Heparin level 0.3-0.7 units/ml Monitor platelets by anticoagulation protocol: Yes   Plan:  - Increase heparin to 650 units/hr - Check anti-Xa level in 8h - Continue to monitor daily H&H, PLTs, and signs of bleeding  Richardine Service, PharmD Candidate 05/23/2018,10:19 AM

## 2018-05-23 NOTE — Progress Notes (Signed)
PULMONARY / CRITICAL CARE MEDICINE   NAME:  Angel Hinton, MRN:  [id number], DOB:  1940-10-26, LOS: 0 ADMISSION DATE:  05/23/2018, CONSULTATION DATE:  REFERRING MD:  ED, CHIEF COMPLAINT: Dyspnea  BRIEF HISTORY:         This is a 77 year old with severe COPD at baseline who presented with dyspnea and desaturation.  She was found to have EKG changes consistent with an acute inferior myocardial infarction but due to her concurrent morbidities it was elected not to perform cardiac catheterization.  She was intubated and found to have a tension pneumothorax.  A chest tube has been placed.  Initial troponin was only 0.09. HISTORY OF PRESENT ILLNESS        77 yr old female w/ PMHx of severe COPD (last FEV1 37%, DLCO 27) not compliant with any bronchodilators, On home oxygen last seen in Pulmonary clinic 07/2017. Presents with SOB, Sats 38%-> increased to 72% on 4L [place] with no complaint of chest pain. Found on EKG to have ST elevations in the inferior and precordial leads.   Past Medical History    SIGNIFICANT PAST MEDICAL HISTORY   Severe COPD, CHF, coronary hypertension  SIGNIFICANT EVENTS:  Intubated 10/7 STUDIES:    CULTURES:  Respiratory culture pending  ANTIBIOTICS:  None  LINES/TUBES:   Pigtail placed 10/7 CONSULTANTS:   SUBJECTIVE:       She is sedated on fentanyl and somewhat slow to respond but denies dyspnea or chest pain.  CONSTITUTIONAL: BP (!) 84/72   Pulse 76   Temp 100.2 F (37.9 C)   Resp (!) 8   Wt 39.7 kg   SpO2 100%   BMI 14.56 kg/m   I/O last 3 completed shifts: In: 372.8 [I.V.:67.5; IV Piggyback:305.3] Out: 236 [Urine:120; Chest Tube:116]     Vent Mode: PRVC FiO2 (%):  [80 %-100 %] 80 % Set Rate:  [18 bmp] 18 bmp Vt Set:  [450 mL] 450 mL PEEP:  [5 cmH20] 5 cmH20 Plateau Pressure:  [17 cmH20-22 cmH20] 17 cmH20  PHYSICAL EXAM: General: More cachectic woman who is orally intubated and in no distress Neuro: Nodding appropriately to questions but  somewhat lethargic HEENT: Pupils are equal.  There is no JVD. Cardiovascular: S1 and S2 are extremely distant and regular without murmur rub or gallop.  There is no dependent edema.  The periphery is pink and warm Lungs: There is symmetric air movement, no wheezes at present, no trapping by flow versus time.  Continues to have a small air leak from the right pigtail Abdomen: The abdomen is flat and soft without any organomegaly masses or tenderness Musculoskeletal: There is no dependent edema Skin:    RESOLVED PROBLEM LIST   ASSESSMENT AND PLAN     SUMMARY OF TODAY'S PLAN:  This is a 77 year old with advanced COPD who presented with acute dyspnea and desaturation was found to have a right tension pneumothorax.  In addition she had ST elevations consistent with a RCA territory infarct.  Pertinent to her coronary disease she is continuing on aspirin, statin, and full dose heparin for now.  She is not a candidate for intervention.  Pertinent to her dyspnea this would appear to be on the basis of her tension pneumothorax.  Continuing with her pigtail in place.  Initiating routine bronchodilators and repeating a blood gas later today if she is well compensated I am anticipating attempting to separate her from mechanical ventilation.  She does have a rise in her lactic acid this morning, I do  not suspect underlying infection rather hypoperfusion and hypoxia as a provocation for the lactate.  Serial lactates will be obtained.  Best Practice / Goals of Care / Disposition.   DVT PROPHYLAXIS: Systemic dose heparin SUP: NUTRITION: Anticipating extubation n.p.o. nutrition MOBILITY: GOALS OF CARE: To be discussed with family when they arrive later today LABS  Glucose Recent Labs  Lab 05/23/18 0649  GLUCAP 167*    BMET Recent Labs  Lab 05/23/18 0107  NA 138  K 2.8*  CL 96*  CO2 31  BUN 22  CREATININE 0.68  GLUCOSE 276*    Liver Enzymes Recent Labs  Lab 05/23/18 0107  AST 21  ALT 15   ALKPHOS 55  BILITOT 0.8  ALBUMIN 3.5    Electrolytes Recent Labs  Lab 05/23/18 0107  CALCIUM 8.9  MG 3.7*    CBC Recent Labs  Lab 05/23/18 0107  WBC 16.5*  HGB 13.7  HCT 46.2*  PLT 279    ABG Recent Labs  Lab 05/23/18 0155  PHART 7.319*  PCO2ART 60.8*  PO2ART 135.0*    Coag's Recent Labs  Lab 05/23/18 0107  APTT 20*  INR 0.92    Sepsis Markers Recent Labs  Lab 05/23/18 0107 05/23/18 0551  LATICACIDVEN  --  4.8*  PROCALCITON <0.10  --     Cardiac Enzymes Recent Labs  Lab 05/23/18 0107  TROPONINI 0.09*    PAST MEDICAL HISTORY :   She  has a past medical history of CHF (congestive heart failure) (HCC) and COPD (chronic obstructive pulmonary disease) ([place]).  PAST SURGICAL HISTORY:  She  has no past surgical history on file.  Allergies  Allergen Reactions  . Erythromycin Anaphylaxis  . Amoxicillin Other (See Comments)    Not sure  . Amoxicillin-Pot Clavulanate Other (See Comments)    Not sure  . Prednisone     Elevates bp  . Spiriva Handihaler [Tiotropium Bromide Monohydrate]     No current facility-administered medications on file prior to encounter.    Current Outpatient Medications on File Prior to Encounter  Medication Sig  . hydrochlorothiazide (HYDRODIURIL) 25 MG tablet Take 25 mg by mouth daily.  Marland Kitchen UNABLE TO FIND Med Name: [facility]ME- [facility]  Oxygen 2lpm 24/7    FAMILY HISTORY:   Her family history includes Diabetes in her sister; Hypertension in her father and sister.  SOCIAL HISTORY:  She  reports that she quit smoking about 6 years ago. Her smoking use included cigarettes. She has a 49.00 pack-year smoking history. She has never used smokeless tobacco. She reports that she does not drink alcohol or use drugs.  REVIEW OF SYSTEMS:

## 2018-05-23 NOTE — Progress Notes (Addendum)
Interventional Cardiology Note:  Pt arrived to ED via EMS as Code STEMI. She is an emaciated female severe COPD who came from home. Apparently she called her caregiver and reported dyspnea. EMS was called by her caregiver. Upon arrival to the ED, O2 sats are 38%. No complaint of chest pain. She was intubated in the ED. Chest x-ray with large right sided pneumothorax with mediastinal shift. EKG with sinus tachycardia with ST elevation in the inferior and precordial leads. Her EKG is consistent with an acute inferior MI.  On exam she is cachectic. She is minimally responsive prior to being intubated. Chart review shows that she has severe COPD and has been non-compliant with medications. She is a former smoker.   She does not appear to be a candidate for cardiac cath given her overall condition including pneumothorax, end stage COPD and cachexia/failure to thrive. Her current condition is complicated by a likely acute MI. She is hemodynamically stable currently. Her acute MI is likely from involvement of the RCA.   I would treat with ASA and heparin for now. Will cancel code STEMI. I would not consider her a candidate for cardiac cath. Her family will need to included to discuss overall goals of care including DNR status. The ED team is currently placing a chest tube for treatment of pneumothorax.   Lauree Chandler 05/23/2018 1:50 AM

## 2018-05-23 NOTE — Progress Notes (Signed)
ANTICOAGULATION CONSULT NOTE - Initial Consult  Pharmacy Consult for Heparin Indication: chest pain/ACS  Allergies  Allergen Reactions  . Erythromycin Anaphylaxis  . Amoxicillin Other (See Comments)    Not sure  . Amoxicillin-Pot Clavulanate Other (See Comments)    Not sure  . Prednisone     Elevates bp  . Spiriva Handihaler [Tiotropium Bromide Monohydrate]     Patient Measurements: Weight: 85 lb (38.6 kg)  Vital Signs: Temp: 96.4 F (35.8 C) (10/07 0057) Temp Source: Temporal (10/07 0057) BP: 169/77 (10/07 0057) Pulse Rate: 119 (10/07 0109)  Labs: Recent Labs    05/23/18 0107  HGB 13.7  HCT 46.2*  PLT 279  APTT 20*  LABPROT 12.3  INR 0.92    CrCl cannot be calculated (Patient's most recent lab result is older than the maximum 21 days allowed.).   Medical History: Past Medical History:  Diagnosis Date  . CHF (congestive heart failure) ([place])   . COPD (chronic obstructive pulmonary disease) (HCC)     Medications:  Awaiting home med rec  Assessment: 77 y.o. F presents with SOB requiring intubation in the ED. Also found to have R sided pneumothorax requiring chest tube insertion.  Pt also CODE STEMI but will not be taken emergently to the lab with other issues ongoing right now. Loaded with heparin 2300 units/hr in ED. Pharmacy consulted to start heparin gtt. CBC ok on admission.  Goal of Therapy:  Heparin level 0.3-0.7 units/ml Monitor platelets by anticoagulation protocol: Yes   Plan:  Heparin gtt at 500 units/hr Will f/u heparin level in 8 hours Daily heparin level and CBC  Sherlon Handing, PharmD, BCPS Clinical pharmacist  **Pharmacist phone directory can now be found on [email or web address] ([email or web address]).  Listed under [place]. 05/23/2018,1:39 AM

## 2018-05-23 NOTE — ED Notes (Signed)
PT caregiver Exie Parody would like an update [phone]

## 2018-05-23 NOTE — Plan of Care (Signed)
  Problem: Clinical Measurements: Goal: Respiratory complications will improve Outcome: Progressing Pt tolerating ventilator at this time Goal: Cardiovascular complication will be avoided Outcome: Progressing   Problem: Elimination: Goal: Will not experience complications related to bowel motility Outcome: Progressing  Foley in place- minimal output Problem: Pain Managment: Goal: General experience of comfort will improve Outcome: Progressing  Pt is currently on Fentanyl drip Problem: Safety: Goal: Ability to remain free from injury will improve Outcome: Progressing   Problem: Skin Integrity: Goal: Risk for impaired skin integrity will decrease Outcome: Progressing  Pt being turned every 2 hours

## 2018-05-23 NOTE — Procedures (Addendum)
PCCM   Reposition Right chest tube  Noted on CXR to be abutting mediastinum On evaluation no air leak   Using sterile gloves Cleaned skin around tube with chlorhexidine Cut previous sutures Pulled back chest tube by 3-4cm until there was leak seen in pleurvac Resutured to skin vaseline gauze placed and taped  Dressing clean dry and intact. Peak and plateau pressures on mech vent wnl O2 sats 100% Pt tolerated well  .Marland KitchenSigned Dr Seward Carol Pulmonary Critical Care Locums

## 2018-05-23 NOTE — H&P (Addendum)
..   NAME:  Angel Hinton, MRN:  [id number], DOB:  11/07/1940, LOS: 0 ADMISSION DATE:  05/23/2018, CONSULTATION DATE:  05/23/18 REFERRING MD:  Roxanne Mins MD, CHIEF COMPLAINT:  CODE STEMI, SOB, HYPOXIA   Brief History   77 yr old female w/ PMHx of severe COPD (last FEV1 37%, DLCO 27) not compliant with any bronchodilators, On home oxygen last seen in Pulmonary clinic 07/2017. Presents with SOB, Sats 38%-> increased to 72% on 4L [place] with no complaint of chest pain. Found on EKG to have ST elevations in the inferior and precordial leads.   Past Medical History  Severe COPD Decompensated CHF Chronic right middle lobe scarring following a prior pneumonia Pulmonary hypertension  Consults: date of consult/date signed off & final recs:  Cardiology-  ASA and Heparin for now. Not a candidate for cardiac cath. Recommended goals of care conversation.  Procedures (surgical and bedside):  Intubation by Dr Roxanne Mins Right sided pigtail for resolution of right sided PTX  Significant Diagnostic Tests:  CXR #1 IMPRESSION: 1. Large right pneumothorax without mediastinal shift. 2. Endotracheal tube 4.6 cm from the carina. The side port of the enteric tube is in the mid esophagus, recommend advancement of approximately 15 cm. 3. Hyperinflation and bronchial thickening, imaging findings consistent with COPD. 4. Patchy left basilar opacity may be atelectasis, pneumonia or aspiration.   CXR #2IMPRESSION: Decreased pneumothorax after chest tube placement. Residual gas is at least 25%. Orogastric tube tip and side-port continues to terminate in the lower esophagus.  EKG: OCT-2019 02:30:44 [facility]/ED ROUTINE RECORD Sinus rhythm, Atrial premature complex Nonspecific IVCD with LAD   Left ventricular hypertrophy Inferior infarct, acute and Anterior infarct, acute (LAD) Vent. rate 93 BPM     PR interval * ms    QRS duration 120 ms   QT/QTc 418/520 ms P-R-T axes 127 -87 86   Micro Data:    none  Antimicrobials:  none   Subjective:  Per the documentation of other providers and EMR  Pt presented from home short of breath having back pain denying chest pain initial Sat in ED 38% blue in appearance and only improved to 72% with 4L [place].   Objective   Blood pressure 108/60, pulse 94, temperature 100 F (37.8 C), resp. rate 20, weight 38.6 kg, SpO2 100 %.    Vent Mode: PRVC FiO2 (%):  [80 %-100 %] 80 % Set Rate:  [18 bmp] 18 bmp Vt Set:  [450 mL] 450 mL PEEP:  [5 cmH20] 5 cmH20 Plateau Pressure:  [17 cmH20-22 cmH20] 17 cmH20  No intake or output data in the 24 hours ending 05/23/18 0415 Filed Weights   05/23/18 0051  Weight: 38.6 kg    Examination: General: cachetic female intubated HENT: ETT and OGT in oropharynx Lungs: coarse breath sounds b/l + wheezing Cardiovascular: S1 and S2 Abdomen: soft scaphoid non distended + BS Extremities: thin  Neuro: sedated RASS 0 to -1  Resolved Hospital Problem list   Right sided pneumothorax resolved- 25% basal noted on repeat CXR.  Assessment & Plan:  1. Acute Coronary Syndrome: STEMI involving the inferior and precordial leads. Etiology may be from acute blockage of coronary vessels vs secondary to acute right sided tension pneumothorax seen on presentation.  Plan: Continue to trend Trops and EKGs ASA given Heparin ggt started- monitor PTTs per protocol Not a candidate for LHC per cardiology 2D ECHO to assess LVEF MAP goal >74mmHg Cholesterol 225 LDL 122 on Lipid panel - Atorvastatin 40 mg  2. Acute Right sided Tension PTX relieved by Right sided chest tube ( pigtail 14 french) Confirmed position and re-expansion of lung by CXR Plan: Sent for CT chest to evaluate source of PTX Pulled back pigtail by 3-4cm to try and relieve basal 25% air seen on CXR Continue on 20cm H20 suction via pleurvac  3. Acute on Chronic Respiratory Failure with Hypoxia and Hypercarbia: H/o severe COPD reviewed PFTs from 2013 Was being  followed in clinic by Dr Lamonte Sakai who documents pt's hesitation to inhaled bronchodilators. Several allergies listed on the chart prevent optimization with inhaled therapies at this time. Plan: TV 8cc/kg FiO2 wean to keep O2 Sat 90-92%  ABG shows chronic  respiratory acidosis with metabolic alkalosis If peaks and plateau pressures both elevated check chest tube, make sure its on suction  4. Hypokalemia: replaced by 70 mEq given in total between ED and ICU f/u repeat BMET  5. Pain and Sedation: Plan: RASS goal 0 to -1 Started on continuous fentanyl ggt and intermittent versed Discontinue propofol ggt Try to wean sedation and assess mental status now that pt is more stable  6. Hyperglycemic on BMET- received steroids en route to hospital Plan: HgbA1c- 5.7 pre diabetic started on ISS- Phase 1 ICU glycemic protocol  7. Leukocytosis noted on CBC H/o receiving IV steroid en route by EMS PCT <0.1 No need for antibiotics at this time   Disposition / Summary of Today's Plan 05/23/18   Admit to ICU.  Acute Coronary Syndrome STEMI- not a candidate for LHC started on anticoagulation. Right sided Pneumothorax s/p Right pigtail catheter to suction.  Severe COPD with Acute on chronic hypoxic respiratory failure- intubated on mechanical ventilation.     Diet: NPO Pain/Anxiety/Delirium protocol (if indicated): Continuous fentanyl with prn Versed. Originally on Propofol in ED.  VAP protocol (if indicated): yes DVT prophylaxis: Heparin ggt for ACS GI prophylaxis: PPI Hyperglycemia protocol: ISS started for BG exceeding 180mg /dl Mobility: Bed rest Code Status: Full Family Communication: spoke to pt's sister Jen Mow [phone] She stated that she had several conversations with her sister but to her knowledge she does not have a living will or advanced directive.  She has no children and her husband has passed. Exie Parody is a friend of the family and can be updated regarding her clinical  condition.   When asked what the patient's wishes would be in the event of cardiac arrest or continued compromise despite aggressive medical intervention. Hassan Rowan stated that her sister was like her mother and would not want to be sustained by artificial means if it was irreversible and would not want to be resuscitated. I advised that she come into the hospital so we could clarify goals of care further.  Labs   CBC: Recent Labs  Lab 05/23/18 0107  WBC 16.5*  NEUTROABS 10.1*  HGB 13.7  HCT 46.2*  MCV 98.3  PLT 629    Basic Metabolic Panel: Recent Labs  Lab 05/23/18 0107  NA 138  K 2.8*  CL 96*  CO2 31  GLUCOSE 276*  BUN 22  CREATININE 0.68  CALCIUM 8.9  MG 3.7*   GFR: CrCl cannot be calculated (Unknown ideal weight.). Recent Labs  Lab 05/23/18 0107  WBC 16.5*    Liver Function Tests: Recent Labs  Lab 05/23/18 0107  AST 21  ALT 15  ALKPHOS 55  BILITOT 0.8  PROT 6.4*  ALBUMIN 3.5   No results for input(s): LIPASE, AMYLASE in the last 168 hours. No results for  input(s): AMMONIA in the last 168 hours.  ABG    Component Value Date/Time   PHART 7.319 (L) 05/23/2018 0155   PCO2ART 60.8 (H) 05/23/2018 0155   PO2ART 135.0 (H) 05/23/2018 0155   HCO3 31.6 (H) 05/23/2018 0155   TCO2 33 (H) 05/23/2018 0155   O2SAT 99.0 05/23/2018 0155     Coagulation Profile: Recent Labs  Lab 05/23/18 0107  INR 0.92    Cardiac Enzymes: Recent Labs  Lab 05/23/18 0107  TROPONINI 0.09*    HbA1C: Hgb A1c MFr Bld  Date/Time Value Ref Range Status  05/23/2018 01:07 AM 5.7 (H) 4.8 - 5.6 % Final    Comment:    (NOTE) Pre diabetes:          5.7%-6.4% Diabetes:              >6.4% Glycemic control for   <7.0% adults with diabetes     CBG: No results for input(s): GLUCAP in the last 168 hours.  Admitting History of Present Illness.   obtained from the EMR and account of other providers - at time of evaluation pt is intubated.   Review of Systems:   Marland KitchenMarland KitchenReview of  Systems  Unable to perform ROS: Intubated     Past Medical History  She,  has a past medical history of CHF (congestive heart failure) ([place]) and COPD (chronic obstructive pulmonary disease) ([place]).   Surgical History   History reviewed. No pertinent surgical history.   Social History   Social History   Socioeconomic History  . Marital status: Widowed    Spouse name: Not on file  . Number of children: Not on file  . Years of education: Not on file  . Highest education level: Not on file  Occupational History  . Not on file  Social Needs  . Financial resource strain: Not on file  . Food insecurity:    Worry: Not on file    Inability: Not on file  . Transportation needs:    Medical: Not on file    Non-medical: Not on file  Tobacco Use  . Smoking status: Former Smoker    Packs/day: 1.00    Years: 49.00    Pack years: 49.00    Types: Cigarettes    Last attempt to quit: 09/14/2011    Years since quitting: 6.6  . Smokeless tobacco: Never Used  Substance and Sexual Activity  . Alcohol use: No  . Drug use: No  . Sexual activity: Never    Birth control/protection: None  Lifestyle  . Physical activity:    Days per week: Not on file    Minutes per session: Not on file  . Stress: Not on file  Relationships  . Social connections:    Talks on phone: Not on file    Gets together: Not on file    Attends religious service: Not on file    Active member of club or organization: Not on file    Attends meetings of clubs or organizations: Not on file    Relationship status: Not on file  . Intimate partner violence:    Fear of current or ex partner: Not on file    Emotionally abused: Not on file    Physically abused: Not on file    Forced sexual activity: Not on file  Other Topics Concern  . Not on file  Social History Narrative  . Not on file  ,  reports that she quit smoking about 6 years ago. Her smoking  use included cigarettes. She has a 49.00 pack-year smoking history.  She has never used smokeless tobacco. She reports that she does not drink alcohol or use drugs.   Family History   Her family history includes Diabetes in her sister; Hypertension in her father and sister.   Allergies Allergies  Allergen Reactions  . Erythromycin Anaphylaxis  . Amoxicillin Other (See Comments)    Not sure  . Amoxicillin-Pot Clavulanate Other (See Comments)    Not sure  . Prednisone     Elevates bp  . Spiriva Handihaler [Tiotropium Bromide Monohydrate]      Home Medications  Prior to Admission medications   Medication Sig Start Date End Date Taking? Authorizing Provider  hydrochlorothiazide (HYDRODIURIL) 25 MG tablet Take 25 mg by mouth daily.    [provider]  UNABLE TO FIND Med Name: DME- [facility]  Oxygen 2lpm 24/7    [provider]      I, Dr Seward Carol have personally reviewed patient's available data, including medical history, events of note, physical examination and test results as part of my evaluation. I have discussed with NP Dewaine Oats and other care providers such as pharmacist, RN and Elink.  In addition,  I personally evaluated patient  The patient is critically ill with multiple organ systems failure and requires high complexity decision making for assessment and support, frequent evaluation and titration of therapies, application of advanced monitoring technologies and extensive interpretation of multiple databases.   Critical Care Time devoted to patient care services described in this note is 85 Minutes. This time reflects time of care of this signee Dr Seward Carol. This critical care time does not reflect procedure time, or teaching time or supervisory time of NP but could involve care discussion time   CC TIME: 68 minutes CODE STATUS:FULL DISPOSITION:ICU PROGNOSIS:Poor FAMILY:   Dr. Seward Carol Pulmonary Critical Care Medicine  05/23/2018 4:39 AM

## 2018-05-23 NOTE — Progress Notes (Signed)
  Echocardiogram 2D Echocardiogram has been performed.  Jennette Dubin 05/23/2018, 9:40 AM

## 2018-05-23 NOTE — ED Triage Notes (Signed)
Patient from home, called care giver stating she is having shortness of breath.  Patient having back pain with the shortness of breath.  She denies any chest pain.  Patient was given 10mg  albuterol, 0.5mg  atrovent, 125mg  solumedrol and 2gm of Magnesium.  Patient's sats never above 88% with EMS.  Originally sats were 72% on [place] at 4L, what she wears at home.

## 2018-05-23 NOTE — Code Documentation (Signed)
Dr Julianne Handler at bedside.

## 2018-05-24 ENCOUNTER — Inpatient Hospital Stay (HOSPITAL_COMMUNITY): Payer: Medicare Other

## 2018-05-24 LAB — HEPARIN LEVEL (UNFRACTIONATED): Heparin Unfractionated: 0.39 IU/mL (ref 0.30–0.70)

## 2018-05-24 LAB — POCT I-STAT 3, ART BLOOD GAS (G3+)
ACID-BASE EXCESS: 2 mmol/L (ref 0.0–2.0)
Acid-Base Excess: 2 mmol/L (ref 0.0–2.0)
Acid-Base Excess: 5 mmol/L — ABNORMAL HIGH (ref 0.0–2.0)
BICARBONATE: 27.3 mmol/L (ref 20.0–28.0)
BICARBONATE: 27.8 mmol/L (ref 20.0–28.0)
BICARBONATE: 29.7 mmol/L — AB (ref 20.0–28.0)
O2 Saturation: 93 %
O2 Saturation: 76 %
O2 Saturation: 85 %
TCO2: 29 mmol/L (ref 22–32)
TCO2: 29 mmol/L (ref 22–32)
TCO2: 31 mmol/L (ref 22–32)
pCO2 arterial: 48.6 mmHg — ABNORMAL HIGH (ref 32.0–48.0)
pCO2 arterial: 49.5 mmHg — ABNORMAL HIGH (ref 32.0–48.0)
pCO2 arterial: 50.4 mmHg — ABNORMAL HIGH (ref 32.0–48.0)
pH, Arterial: 7.361 (ref 7.350–7.450)
pH, Arterial: 7.357 (ref 7.350–7.450)
pH, Arterial: 7.393 (ref 7.350–7.450)
pO2, Arterial: 74 mmHg — ABNORMAL LOW (ref 83.0–108.0)
pO2, Arterial: 46 mmHg — ABNORMAL LOW (ref 83.0–108.0)
pO2, Arterial: 58 mmHg — ABNORMAL LOW (ref 83.0–108.0)

## 2018-05-24 LAB — BASIC METABOLIC PANEL
Anion gap: 7 (ref 5–15)
BUN: 18 mg/dL (ref 8–23)
CHLORIDE: 110 mmol/L (ref 98–111)
CO2: 25 mmol/L (ref 22–32)
Calcium: 8.4 mg/dL — ABNORMAL LOW (ref 8.9–10.3)
Creatinine, Ser: 0.33 mg/dL — ABNORMAL LOW (ref 0.44–1.00)
GFR calc Af Amer: >60 mL/min (ref >60)
GFR calc non Af Amer: >60 mL/min (ref >60)
Glucose, Bld: 109 mg/dL — ABNORMAL HIGH (ref 70–99)
POTASSIUM: 4.1 mmol/L (ref 3.5–5.1)
SODIUM: 142 mmol/L (ref 135–145)

## 2018-05-24 LAB — GLUCOSE, CAPILLARY
Glucose-Capillary: 104 mg/dL — ABNORMAL HIGH (ref 70–99)
Glucose-Capillary: 105 mg/dL — ABNORMAL HIGH (ref 70–99)
Glucose-Capillary: 123 mg/dL — ABNORMAL HIGH (ref 70–99)
Glucose-Capillary: 190 mg/dL — ABNORMAL HIGH (ref 70–99)
Glucose-Capillary: 201 mg/dL — ABNORMAL HIGH (ref 70–99)
Glucose-Capillary: 221 mg/dL — ABNORMAL HIGH (ref 70–99)
Glucose-Capillary: 98 mg/dL (ref 70–99)

## 2018-05-24 LAB — POTASSIUM: Potassium: 3.4 mmol/L — ABNORMAL LOW (ref 3.5–5.1)

## 2018-05-24 LAB — LACTIC ACID, PLASMA: LACTIC ACID, VENOUS: 1.3 mmol/L (ref 0.5–1.9)

## 2018-05-24 LAB — MAGNESIUM
MAGNESIUM: 1.9 mg/dL (ref 1.7–2.4)
MAGNESIUM: 1.8 mg/dL (ref 1.7–2.4)

## 2018-05-24 LAB — TROPONIN I: Troponin I: 3.99 ng/mL (ref <0.03)

## 2018-05-24 LAB — PROCALCITONIN: Procalcitonin: 1.41 ng/mL

## 2018-05-24 MED ORDER — AMIODARONE HCL IN DEXTROSE 360-4.14 MG/200ML-% IV SOLN
INTRAVENOUS | Status: AC
  Administered 2018-05-24: 150 mg via INTRAVENOUS
  Filled 2018-05-24: qty 200

## 2018-05-24 MED ORDER — AMIODARONE LOAD VIA INFUSION
150.0000 mg | Freq: Once | INTRAVENOUS | Status: AC
  Administered 2018-05-24: 150 mg via INTRAVENOUS

## 2018-05-24 MED ORDER — DEXMEDETOMIDINE HCL IN NACL 400 MCG/100ML IV SOLN
0.4000 ug/kg/h | INTRAVENOUS | Status: DC
  Administered 2018-05-24 (×2): 0.4 ug/kg/h via INTRAVENOUS
  Administered 2018-05-25 – 2018-05-26 (×2): 0.5 ug/kg/h via INTRAVENOUS
  Administered 2018-05-27: 1.2 ug/kg/h via INTRAVENOUS
  Filled 2018-05-24 (×5): qty 100

## 2018-05-24 MED ORDER — PIPERACILLIN-TAZOBACTAM 3.375 G IVPB
3.3750 g | Freq: Three times a day (TID) | INTRAVENOUS | Status: AC
  Administered 2018-05-24 – 2018-05-28 (×12): 3.375 g via INTRAVENOUS
  Filled 2018-05-24 (×11): qty 50

## 2018-05-24 MED ORDER — AMIODARONE HCL IN DEXTROSE 360-4.14 MG/200ML-% IV SOLN
30.0000 mg/h | INTRAVENOUS | Status: DC
  Administered 2018-05-24 – 2018-05-25 (×4): 30 mg/h via INTRAVENOUS
  Filled 2018-05-24 (×2): qty 200

## 2018-05-24 MED ORDER — LEVALBUTEROL HCL 0.63 MG/3ML IN NEBU
0.6300 mg | INHALATION_SOLUTION | RESPIRATORY_TRACT | Status: DC
  Administered 2018-05-24 – 2018-06-02 (×56): 0.63 mg via RESPIRATORY_TRACT
  Filled 2018-05-24 (×56): qty 3

## 2018-05-24 MED ORDER — AMIODARONE HCL IN DEXTROSE 360-4.14 MG/200ML-% IV SOLN
60.0000 mg/h | INTRAVENOUS | Status: AC
  Administered 2018-05-24 (×2): 60 mg/h via INTRAVENOUS
  Filled 2018-05-24: qty 200

## 2018-05-24 MED ORDER — MAGNESIUM SULFATE 2 GM/50ML IV SOLN
2.0000 g | Freq: Once | INTRAVENOUS | Status: AC
  Administered 2018-05-24: 2 g via INTRAVENOUS
  Filled 2018-05-24: qty 50

## 2018-05-24 MED ORDER — NOREPINEPHRINE 4 MG/250ML-% IV SOLN
0.0000 ug/min | INTRAVENOUS | Status: DC
  Administered 2018-05-24: 30 ug/min via INTRAVENOUS
  Administered 2018-05-24: 10 ug/min via INTRAVENOUS
  Administered 2018-05-24: 20 ug/min via INTRAVENOUS
  Filled 2018-05-24 (×3): qty 250

## 2018-05-24 MED ORDER — CARVEDILOL 3.125 MG PO TABS: 3.1250 mg | ORAL_TABLET | Freq: Two times a day (BID) | ORAL | Status: DC

## 2018-05-24 MED ORDER — AMIODARONE HCL IN DEXTROSE 360-4.14 MG/200ML-% IV SOLN
INTRAVENOUS | Status: AC
  Filled 2018-05-24: qty 200

## 2018-05-24 MED ORDER — ACETAMINOPHEN 160 MG/5ML PO SOLN
650.0000 mg | ORAL | Status: DC | PRN
  Administered 2018-05-24 – 2018-05-25 (×3): 650 mg
  Filled 2018-05-24 (×3): qty 20.3

## 2018-05-24 MED ORDER — SODIUM CHLORIDE 0.9 % IV BOLUS
500.0000 mL | Freq: Once | INTRAVENOUS | Status: AC
  Administered 2018-05-24: 500 mL via INTRAVENOUS

## 2018-05-24 MED ORDER — POTASSIUM CHLORIDE 20 MEQ/15ML (10%) PO SOLN
20.0000 meq | Freq: Once | ORAL | Status: AC
  Administered 2018-05-24: 20 meq via ORAL
  Filled 2018-05-24: qty 15

## 2018-05-24 MED ORDER — POTASSIUM CHLORIDE 10 MEQ/50ML IV SOLN
10.0000 meq | INTRAVENOUS | Status: AC
  Administered 2018-05-24 (×2): 10 meq via INTRAVENOUS
  Filled 2018-05-24 (×2): qty 50

## 2018-05-24 MED ORDER — FUROSEMIDE 10 MG/ML IJ SOLN
40.0000 mg | Freq: Once | INTRAMUSCULAR | Status: AC
  Administered 2018-05-24: 40 mg via INTRAVENOUS
  Filled 2018-05-24: qty 4

## 2018-05-24 MED ORDER — ATORVASTATIN CALCIUM 40 MG PO TABS
40.0000 mg | ORAL_TABLET | Freq: Every day | ORAL | Status: DC
  Administered 2018-05-24 – 2018-06-02 (×10): 40 mg
  Filled 2018-05-24 (×10): qty 1

## 2018-05-24 MED ORDER — NOREPINEPHRINE 16 MG/250ML-% IV SOLN
0.0000 ug/min | INTRAVENOUS | Status: DC
  Administered 2018-05-24: 20 ug/min via INTRAVENOUS
  Administered 2018-05-25: 10 ug/min via INTRAVENOUS
  Administered 2018-05-26: 2 ug/min via INTRAVENOUS
  Administered 2018-05-27: 11 ug/min via INTRAVENOUS
  Administered 2018-05-28 – 2018-05-29 (×2): 18 ug/min via INTRAVENOUS
  Administered 2018-05-29: 15 ug/min via INTRAVENOUS
  Administered 2018-05-30: 14 ug/min via INTRAVENOUS
  Administered 2018-05-31: 13 ug/min via INTRAVENOUS
  Administered 2018-06-01: 18 ug/min via INTRAVENOUS
  Administered 2018-06-02: 20 ug/min via INTRAVENOUS
  Filled 2018-05-24 (×11): qty 250

## 2018-05-24 NOTE — Progress Notes (Signed)
Pharmacy Antibiotic Note  Angel Hinton is a 77 y.o. female admitted on 05/23/2018 with CP and SOB.  Pharmacy has been consulted for Zosyn dosing due to elevated WBC and fevers. Amoxicillin allergy noted, but pt-reported unknown reaction - will monitor closely.  Plan: Zosyn 3.375g IV EI q8h Monitor cultures, LOT, renal funx  Height: 5\' 5"  (165.1 cm) Weight: 97 lb (44 kg) IBW/kg (Calculated) : 57  Temp (24hrs), Avg:100.4 F (38 C), Min:99.9 F (37.7 C), Max:101.1 F (38.4 C)  Recent Labs  Lab 05/23/18 0107 05/23/18 0551 05/23/18 0919 05/24/18 0213 05/24/18 1059  WBC 16.5*  --   --   --   --   CREATININE 0.68  --   --  0.33*  --   LATICACIDVEN  --  4.8* 4.1*  --  1.3    Estimated Creatinine Clearance: 42.2 mL/min (A) (by C-G formula based on SCr of 0.33 mg/dL (L)).    Allergies  Allergen Reactions  . Erythromycin Anaphylaxis  . Amoxicillin Other (See Comments)    Not sure  . Amoxicillin-Pot Clavulanate Other (See Comments)    Not sure  . Prednisone     Elevates bp  . Spiriva Handihaler [Tiotropium Bromide Monohydrate]     Antimicrobials this admission: Zosyn 10/8 >>   Dose adjustments this admission: none  Microbiology results: ordered  Thank you for allowing pharmacy to be a part of this patient's care.  Arrie Senate, PharmD, BCPS Clinical Pharmacist [phone] Please check AMION for all [place] numbers 05/24/2018

## 2018-05-24 NOTE — Progress Notes (Signed)
SLP Cancellation Note  Patient Details Name: Olyvia Gopal MRN: [id number] DOB: 21-May-1941   Cancelled treatment:       Reason Eval/Treat Not Completed: Patient not medically ready (still intubated per chart review). Will f/u for swallow evaluation post-extubation as able.   Germain Osgood 05/24/2018, 3:41 PM  Germain Osgood, M.A. [place] Acute Environmental education officer [phone] Office [phone]

## 2018-05-24 NOTE — Procedures (Signed)
Central venous catheter placement  Indication: Infusion of pressors  Procedure: After obtaining informed consent and performing a timeout the area over the left internal jugular vein was extensively prepped with chlorhexidine and then widely draped.  Sterile garb was donned and under ultrasound guidance the right IJ was easily cannulated.  A wire was gently passed and the skin was sharply incised.  The tract was dilated and a 7 French triple-lumen 20 cm catheter was advanced to 15 cm.  There was good flow from all ports.  The catheter was sutured in place and a sterile dressing applied.  Chest x-ray shows good line placement and no pneumothorax.

## 2018-05-24 NOTE — Progress Notes (Signed)
RT attempted to collect sputum culture per order and was not successful at this time.

## 2018-05-24 NOTE — Progress Notes (Signed)
ANTICOAGULATION CONSULT NOTE - Initial Consult  Pharmacy Consult for Heparin Indication: chest pain/ACS  Allergies  Allergen Reactions  . Erythromycin Anaphylaxis  . Amoxicillin Other (See Comments)    Not sure  . Amoxicillin-Pot Clavulanate Other (See Comments)    Not sure  . Prednisone     Elevates bp  . Spiriva Handihaler [Tiotropium Bromide Monohydrate]     Patient Measurements: Height: 5\' 5"  (165.1 cm) Weight: 97 lb (44 kg) IBW/kg (Calculated) : 57 Heparin Dosing Weight: 39.7 kg  Vital Signs: Temp: 101.1 F (38.4 C) (10/08 1000) Temp Source: Bladder (10/08 0400) BP: 104/58 (10/08 1000) Pulse Rate: 108 (10/08 1000)  Labs: Recent Labs    05/23/18 0107 05/23/18 0713 05/23/18 0919 05/23/18 1238 05/23/18 1908 05/24/18 0213  HGB 13.7  --   --   --   --   --   HCT 46.2*  --   --   --   --   --   PLT 279  --   --   --   --   --   APTT 20*  --   --   --   --   --   LABPROT 12.3  --   --   --   --   --   INR 0.92  --   --   --   --   --   HEPARINUNFRC  --   --  0.13*  --  0.40 0.39  CREATININE 0.68  --   --   --   --  0.33*  TROPONINI 0.09* 1.43*  --  2.95* 4.32*  --     Estimated Creatinine Clearance: 42.2 mL/min (A) (by C-G formula based on SCr of 0.33 mg/dL (L)).   Medical History: Past Medical History:  Diagnosis Date  . CHF (congestive heart failure) ([place])   . COPD (chronic obstructive pulmonary disease) (HCC)      Assessment: 77 yo F on heparin for chest pain/ACS. Initially presented with SOB requiring intubation and found to have R sided PTX requiring chest tube insertion. Not a candidate for cath at this time due to current clinical status.   HL (0.39) therapeutic on 650 units/hr. CBC was ok on admission (HCT slightly elevated 46.2%). No signs of bleeding or issues with infusion per nursing. Will continue current rate of heparin and continue to monitor levels and signs of bleeding.  Goal of Therapy:  Heparin level 0.3-0.7 units/ml Monitor  platelets by anticoagulation protocol: Yes   Plan:  - Continue heparin 650 units/hr - Continue to monitor daily anti-Xa levels, H&H, PLTs, and signs of bleeding  Richardine Service, PharmD Candidate 05/24/2018,10:51 AM

## 2018-05-24 NOTE — Progress Notes (Signed)
Spoke with on-call Cardiology MD, Akhter about pt's BP. Discussed plan for needing drip in the setting of persistet hypotension. Levophed was ordered per MD instruction. Dr. Paticia Stack stated that he feels comfortable with a SBP goal above 80, with the option to use Levophed should SBP drop below 80. RN will continue to monitor.

## 2018-05-24 NOTE — Progress Notes (Signed)
Pt having increased HR 160-190s having runs of SVT.  Pt intubated but alert and able to nod/gesture to yes/no questions. Pearline Cables MD called to bedside. Pt placed back on full vent support. Pads placed. Amio bolus given and gtt started. Pt converted to NSR 68-75.  Levophed started for hypotension, BP 86/51. O2 sat 99% on 100% FIO2. Will continue to monitor.

## 2018-05-24 NOTE — Progress Notes (Addendum)
PULMONARY / CRITICAL CARE MEDICINE   NAME:  Angel Hinton, MRN:  [id number], DOB:  1941-08-08, LOS: 1 ADMISSION DATE:  05/23/2018, CONSULTATION DATE:  REFERRING MD:  ED, CHIEF COMPLAINT: Dyspnea  BRIEF HISTORY:         This is a 77 year old with severe COPD at baseline who presented with dyspnea and desaturation.  She was found to have EKG changes consistent with an acute inferior myocardial infarction but due to her concurrent morbidities it was elected not to perform cardiac catheterization.  She was intubated and found to have a tension pneumothorax.  A chest tube has been placed.  Initial troponin was only 0.09. HISTORY OF PRESENT ILLNESS        77 yr old female w/ PMHx of severe COPD (last FEV1 37%, DLCO 27) not compliant with any bronchodilators, On home oxygen last seen in Pulmonary clinic 07/2017. Presents with SOB, Sats 38%-> increased to 72% on 4L [place] with no complaint of chest pain. Found on EKG to have ST elevations in the inferior and precordial leads.   Past Medical History   Past Medical History:  Diagnosis Date  . CHF (congestive heart failure) ([place])   . COPD (chronic obstructive pulmonary disease) (H[facility])     SIGNIFICANT PAST MEDICAL HISTORY   Severe COPD, CHF, coronary hypertension  SIGNIFICANT EVENTS:  Intubated 10/7 STUDIES:   Echo 10/7>> Normal LV size with EF 25-30%. Wall motion abnormalities as noted   above. Pattern is suggestive of stress (Takotsubo) cardiomyopathy   versus severe multivessel coronary disease. Moderate diastolic   dysfunction. Normal RV size and systolic function. Dilated IVC   suggestive of elevated RV filling pressure. Pulmonary arteries: PA peak pressure: 39 mm Hg (S). CULTURES:  10/7: Respiratory culture pending 10/7>> MRSA PCR >> Neg 10/7 Blood Culture >> Pending  ANTIBIOTICS:  None  LINES/TUBES:   Pigtail placed 10/7 CONSULTANTS:  Cardiology 10/7 SUBJECTIVE:  Pt.  Remains sedated on Fentanyl 75 mcg/hr . She is awake and  following commands. She is very agitated with minimal stimulation. CONSTITUTIONAL: BP (!) 104/55   Pulse (!) 101   Temp (!) 100.9 F (38.3 C)   Resp 11   Ht 5\' 5"  (1.651 m)   Wt 44 kg   SpO2 (!) 89%   BMI 16.14 kg/m   I/O last 3 completed shifts: In: 4253.8 [I.V.:3179; Other:30; NG/GT:150; IV Piggyback:894.8] Out: 762 [Urine:560; Chest Tube:202]     Vent Mode: PSV;CPAP FiO2 (%):  [40 %-50 %] 40 % Set Rate:  [18 bmp] 18 bmp Vt Set:  [450 mL] 450 mL PEEP:  [5 cmH20] 5 cmH20 Pressure Support:  [8 cmH20] 8 cmH20 Plateau Pressure:  [13 cmH20-15 cmH20] 15 cmH20  PHYSICAL EXAM: General: Sedated intubated thin elderly female supine in bed. Oral ETT, Pigtail cath to [facility] collection system, no leak upon my inspection Neuro:MAE x 4, A&O x 3, Follows commands, anxious HEENT: PERRLA  NCAT, Oral ETT, OG tube Cardiovascular: S1 and S2 , No RMG, RRR per tele.No LE Edema Lungs: Bilateral chest excursion, Diminished per bases,coarse throughout Abdomen: Soft, flat , NT, ND, no organomegally Musculoskeletal: There is no dependent edema, no obvious deformities Skin: Frail, warm dry and intact   RESOLVED PROBLEM LIST   ASSESSMENT AND PLAN   Acute on Chronic Respiratory Failure 2/2 tension pneumothorax CXR 10/8 shows small residual pneumothorax CT with minimal drainage, no leak upon my inspection Weaning on 10/5, but required increase in FiO2 + 3.5 L  Plan Adjust FiO2 and PEEP as  needed Continue weaning Lasix 40 mg x 1  Anticipating extubation after lasix and mag Will need swallow evaluation Maintain sats 88-92% Trend CXR Continue Scheduled BD Aggressive pulmonary toilet  Elevated Troponin Bump to 4.32 on 10/8 EKG changes ??  2/2 tension pneumo Echo with EF 25-30%, Pattern is suggestive of stress (Takotsubo) cardiomyopathy versus severe multivessel coronary disease. Moderate diastolic dysfunction.   Plan: Appreciate cards input Continue ASA, Statin, Heparin Per cards note  not candidate for cath Trend BNP Trend troponin until clear Lasix 40 x 1  Check lactate to ensure resolution Will add low dose coreg  Renal No acute issues Plan Trend BMET Replete electrolytes as needed  ID New low grade fever T Max 101 WBC bump  From 8.6 to 16.5 last 24 CXR without infiltrate Plan: Sputum Culture Urine Culture Trend CBC/ Fever curve Trend blood cultures / micro Culture as is clinically indicated Monitor off ABX for now Check PCT  GI/ Nutrition Pt is cachectic. FTT Body mass index is 16.14 kg/m. Plan: Will need swallow evaluation and regular diet post extubation Meal Supplements If unable to extubate, start Vital AF 1.2 at 15 ml/hr and increase by 10 ml every 12 hours to goal rate of 45 ml/hr Appreciate dietitian assist SUP  Goals of Care Per cards note 10/7, patient is not good candidate for cath. Need goals of care discussion after extubation as patient is alert and appropriate at present and should be able to make her own decision. This will allow for alignment of goals of care with plan of care   SUMMARY OF TODAY'S PLAN:  This is a 78 year old with advanced COPD who presented with acute dyspnea and desaturation was found to have a right tension pneumothorax.  In addition she had ST elevations consistent with a RCA territory infarct.  Pertinent to her coronary disease she is continuing on aspirin, statin, and full dose heparin for now.  She is not a candidate for intervention.  Pertinent to her dyspnea this would appear to be on the basis of her tension pneumothorax.  Continuing with her pigtail in place. No leak noted. CXR shows only slight residual pneumo. Continue  routine bronchodilators we are anticipating attempting to separate her from mechanical ventilation. Will give one time dose of lasix as she is + 3.5 L. We will decrease IVF. Will check lactate to ensure clearing.Will check PCT as WBC has bumped with low grade fever. Blood and sputum  cultures are pending. We will give Mag 2 gram bolus with lasix to optimize for extubation.  Best Practice / Goals of Care / Disposition.   DVT PROPHYLAXIS: Systemic dose heparin YPP:[id number] NUTRITION: Anticipating extubation n.p.o. nutrition MOBILITY:BR/ OOB to chair GOALS OF CARE: To be discussed with family when they arrive later today LABS  Glucose Recent Labs  Lab 05/23/18 1139 05/23/18 1605 05/23/18 1940 05/23/18 2349 05/24/18 0403 05/24/18 0737  GLUCAP 171* 112* 102* 105* 98 123*    BMET Recent Labs  Lab 05/23/18 0107 05/24/18 0213  NA 138 142  K 2.8* 4.1  CL 96* 110  CO2 31 25  BUN 22 18  CREATININE 0.68 0.33*  GLUCOSE 276* 109*    Liver Enzymes Recent Labs  Lab 05/23/18 0107  AST 21  ALT 15  ALKPHOS 55  BILITOT 0.8  ALBUMIN 3.5    Electrolytes Recent Labs  Lab 05/23/18 0107 05/24/18 0213  CALCIUM 8.9 8.4*  MG 3.7* 1.9    CBC Recent Labs  Lab 05/23/18 0107  WBC  16.5*  HGB 13.7  HCT 46.2*  PLT 279    ABG Recent Labs  Lab 05/23/18 0155 05/24/18 0334  PHART 7.319* 7.361  PCO2ART 60.8* 48.6*  PO2ART 135.0* 74.0*    Coag's Recent Labs  Lab 05/23/18 0107  APTT 20*  INR 0.92    Sepsis Markers Recent Labs  Lab 05/23/18 0107 05/23/18 0551 05/23/18 0919  LATICACIDVEN  --  4.8* 4.1*  PROCALCITON <0.10  --   --     Cardiac Enzymes Recent Labs  Lab 05/23/18 0713 05/23/18 1238 05/23/18 1908  TROPONINI 1.43* 2.95* 4.32*    PAST MEDICAL HISTORY :   She  has a past medical history of CHF (congestive heart failure) (HCC) and COPD (chronic obstructive pulmonary disease) ([place]).  PAST SURGICAL HISTORY:  She  has no past surgical history on file.  Allergies  Allergen Reactions  . Erythromycin Anaphylaxis  . Amoxicillin Other (See Comments)    Not sure  . Amoxicillin-Pot Clavulanate Other (See Comments)    Not sure  . Prednisone     Elevates bp  . Spiriva Handihaler [Tiotropium Bromide Monohydrate]     No current  facility-administered medications on file prior to encounter.    Current Outpatient Medications on File Prior to Encounter  Medication Sig  . acetaminophen (TYLENOL) 500 MG tablet Take 500 mg by mouth every 6 (six) hours as needed for mild pain.  . hydrochlorothiazide (HYDRODIURIL) 25 MG tablet Take 25 mg by mouth daily.  Marland Kitchen UNABLE TO FIND Med Name: DME- [facility]  Oxygen 2lpm 24/7    FAMILY HISTORY:   Her family history includes Diabetes in her sister; Hypertension in her father and sister.  SOCIAL HISTORY:  She  reports that she quit smoking about 6 years ago. Her smoking use included cigarettes. She has a 49.00 pack-year smoking history. She has never used smokeless tobacco. She reports that she does not drink alcohol or use drugs.     Magdalen Spatz, AGACNP-BC [place] Pager # [phone] 05/24/2018  10:22 AM

## 2018-05-25 ENCOUNTER — Inpatient Hospital Stay (HOSPITAL_COMMUNITY): Payer: Medicare Other

## 2018-05-25 LAB — GLUCOSE, CAPILLARY
GLUCOSE-CAPILLARY: 111 mg/dL — AB (ref 70–99)
GLUCOSE-CAPILLARY: 116 mg/dL — AB (ref 70–99)
GLUCOSE-CAPILLARY: 140 mg/dL — AB (ref 70–99)
GLUCOSE-CAPILLARY: 90 mg/dL (ref 70–99)
Glucose-Capillary: 116 mg/dL — ABNORMAL HIGH (ref 70–99)
Glucose-Capillary: 100 mg/dL — ABNORMAL HIGH (ref 70–99)

## 2018-05-25 LAB — COMPREHENSIVE METABOLIC PANEL
ALT: 25 U/L (ref 0–44)
ANION GAP: 6 (ref 5–15)
AST: 30 U/L (ref 15–41)
Albumin: 2.5 g/dL — ABNORMAL LOW (ref 3.5–5.0)
Alkaline Phosphatase: 48 U/L (ref 38–126)
BILIRUBIN TOTAL: 1 mg/dL (ref 0.3–1.2)
BUN: 22 mg/dL (ref 8–23)
CHLORIDE: 104 mmol/L (ref 98–111)
CO2: 27 mmol/L (ref 22–32)
Calcium: 8.2 mg/dL — ABNORMAL LOW (ref 8.9–10.3)
Creatinine, Ser: 0.43 mg/dL — ABNORMAL LOW (ref 0.44–1.00)
GFR calc Af Amer: >60 mL/min (ref >60)
GFR calc non Af Amer: >60 mL/min (ref >60)
Glucose, Bld: 129 mg/dL — ABNORMAL HIGH (ref 70–99)
POTASSIUM: 3.4 mmol/L — AB (ref 3.5–5.1)
Sodium: 137 mmol/L (ref 135–145)
TOTAL PROTEIN: 5.3 g/dL — AB (ref 6.5–8.1)

## 2018-05-25 LAB — BASIC METABOLIC PANEL
ANION GAP: 6 (ref 5–15)
BUN: 23 mg/dL (ref 8–23)
CALCIUM: 8.4 mg/dL — AB (ref 8.9–10.3)
CHLORIDE: 108 mmol/L (ref 98–111)
CO2: 27 mmol/L (ref 22–32)
Creatinine, Ser: 0.41 mg/dL — ABNORMAL LOW (ref 0.44–1.00)
GFR calc non Af Amer: >60 mL/min (ref >60)
GLUCOSE: 104 mg/dL — AB (ref 70–99)
POTASSIUM: 3.7 mmol/L (ref 3.5–5.1)
Sodium: 141 mmol/L (ref 135–145)

## 2018-05-25 LAB — PROCALCITONIN: Procalcitonin: 3.3 ng/mL

## 2018-05-25 LAB — MAGNESIUM
MAGNESIUM: 2.5 mg/dL — AB (ref 1.7–2.4)
Magnesium: 2.2 mg/dL (ref 1.7–2.4)

## 2018-05-25 LAB — CBC
HCT: 34.1 % — ABNORMAL LOW (ref 36.0–46.0)
Hemoglobin: 10.6 g/dL — ABNORMAL LOW (ref 12.0–15.0)
MCH: 29.2 pg (ref 26.0–34.0)
MCHC: 31.1 g/dL (ref 30.0–36.0)
MCV: 93.9 fL (ref 80.0–100.0)
NRBC: 0 % (ref 0.0–0.2)
PLATELETS: 256 10*3/uL (ref 150–400)
RBC: 3.63 MIL/uL — AB (ref 3.87–5.11)
RDW: 14.5 % (ref 11.5–15.5)
WBC: 25.7 10*3/uL — ABNORMAL HIGH (ref 4.0–10.5)

## 2018-05-25 LAB — PHOSPHORUS: Phosphorus: 2.1 mg/dL — ABNORMAL LOW (ref 2.5–4.6)

## 2018-05-25 LAB — URINE CULTURE: Culture: NO GROWTH

## 2018-05-25 LAB — LACTIC ACID, PLASMA: LACTIC ACID, VENOUS: 1.6 mmol/L (ref 0.5–1.9)

## 2018-05-25 LAB — HEPARIN LEVEL (UNFRACTIONATED): HEPARIN UNFRACTIONATED: 0.36 [IU]/mL (ref 0.30–0.70)

## 2018-05-25 LAB — BRAIN NATRIURETIC PEPTIDE: B NATRIURETIC PEPTIDE 5: 1522 pg/mL — AB (ref 0.0–100.0)

## 2018-05-25 LAB — TROPONIN I: TROPONIN I: 1.84 ng/mL — AB (ref <0.03)

## 2018-05-25 LAB — TSH: TSH: 1.041 u[IU]/mL (ref 0.350–4.500)

## 2018-05-25 MED ORDER — SODIUM CHLORIDE 0.9% FLUSH: 10.0000 mL | INTRAVENOUS | Status: DC | PRN

## 2018-05-25 MED ORDER — SODIUM CHLORIDE 0.9% FLUSH
10.0000 mL | Freq: Two times a day (BID) | INTRAVENOUS | Status: DC
  Administered 2018-05-25 (×3): 10 mL
  Administered 2018-05-26 (×2): 30 mL
  Administered 2018-05-27 – 2018-05-29 (×4): 10 mL
  Administered 2018-05-30: 40 mL
  Administered 2018-05-31 – 2018-06-01 (×4): 10 mL

## 2018-05-25 MED ORDER — VITAL AF 1.2 CAL PO LIQD
1500.0000 mL | ORAL | Status: DC
  Administered 2018-05-26 – 2018-06-02 (×8): 1500 mL
  Filled 2018-05-25 (×7): qty 1500

## 2018-05-25 MED ORDER — VITAL AF 1.2 CAL PO LIQD
1000.0000 mL | ORAL | Status: DC
  Administered 2018-05-25: 1000 mL

## 2018-05-25 MED ORDER — VITAL HIGH PROTEIN PO LIQD: 1000.0000 mL | ORAL | Status: DC

## 2018-05-25 MED ORDER — CHLORHEXIDINE GLUCONATE CLOTH 2 % EX PADS
6.0000 | MEDICATED_PAD | Freq: Every day | CUTANEOUS | Status: DC
  Administered 2018-05-25 – 2018-05-28 (×4): 6 via TOPICAL

## 2018-05-25 MED ORDER — POTASSIUM CHLORIDE 20 MEQ/15ML (10%) PO SOLN
40.0000 meq | Freq: Once | ORAL | Status: AC
  Administered 2018-05-25: 40 meq via ORAL
  Filled 2018-05-25: qty 30

## 2018-05-25 NOTE — Progress Notes (Signed)
PULMONARY / CRITICAL CARE MEDICINE   NAME:  Keiran Sias, MRN:  [id number], DOB:  05/17/1941, LOS: 2 ADMISSION DATE:  05/23/2018, CONSULTATION DATE:  REFERRING MD:  ED, CHIEF COMPLAINT: Dyspnea  BRIEF HISTORY:         This is a 77 year old with severe COPD at baseline who presented with dyspnea and desaturation.  She was found to have EKG changes consistent with an acute inferior myocardial infarction but due to her concurrent morbidities it was elected not to perform cardiac catheterization.  She was intubated and found to have a tension pneumothorax.  A chest tube has been placed.  Initial troponin was only 0.09, . HISTORY OF PRESENT ILLNESS        77 yr old female w/ PMHx of severe COPD (last FEV1 37%, DLCO 27) not compliant with any bronchodilators, On home oxygen last seen in Pulmonary clinic 07/2017. Presents with SOB, Sats 38%-> increased to 72% on 4L [place] with no complaint of chest pain. Found on EKG to have ST elevations in the inferior and precordial leads.   Troponin on presentation rose to in excess of 5.  She was also found to have a tension pneumothorax and following instant intubation a right-sided pigtail was placed.  She subsequently developed fevers and leukocytosis with an elevated procalcitonin.  She has been started empirically on Zosyn.  On 10/8 she had an episode of SVT associated with hypotension and she was started on amiodarone infusion.  Past Medical History   Past Medical History:  Diagnosis Date  . CHF (congestive heart failure) ([place])   . COPD (chronic obstructive pulmonary disease) (HCC)     SIGNIFICANT PAST MEDICAL HISTORY   Severe COPD, CHF, coronary hypertension  SIGNIFICANT EVENTS:  Intubated 10/7 STUDIES:   Echo 10/7>> Normal LV size with EF 25-30%. Wall motion abnormalities as noted   above. Pattern is suggestive of stress (Takotsubo) cardiomyopathy   versus severe multivessel coronary disease. Moderate diastolic   dysfunction. Normal RV size and  systolic function. Dilated IVC   suggestive of elevated RV filling pressure. Pulmonary arteries: PA peak pressure: 39 mm Hg (S). CULTURES:  10/7: Respiratory culture pending 10/7>> MRSA PCR >> Neg 10/7 Blood Culture >> Pending  ANTIBIOTICS:  Zosyn   LINES/TUBES:   Pigtail placed 10/7 CONSULTANTS:  Cardiology 10/7 SUBJECTIVE:  No further SVT or hemodynamic instability overnight.  Her levo fed has been tapered down to 16 mcg this morning.  She is sedated on a combination of low-dose Precedex and fentanyl but is interacting by nodding.  She denies any chest pain.   CONSTITUTIONAL: BP 139/62 (BP Location: Right Arm)   Pulse 64   Temp 99.9 F (37.7 C) (Oral)   Resp (!) 21   Ht 5\' 5"  (1.651 m)   Wt 45.7 kg   SpO2 95%   BMI 16.77 kg/m   I/O last 3 completed shifts: In: 4916.4 [I.V.:4510.6; IV Piggyback:405.8] Out: 6301 [Urine:1475; Chest Tube:60]     Vent Mode: PRVC FiO2 (%):  [40 %-100 %] 40 % Set Rate:  [18 bmp-20 bmp] 20 bmp Vt Set:  [450 mL-470 mL] 470 mL PEEP:  [5 cmH20-8 cmH20] 8 cmH20 Pressure Support:  [10 cmH20] 10 cmH20 Plateau Pressure:  [17 cmH20-23 cmH20] 18 cmH20  PHYSICAL EXAM: General: Thin to cachectic elderly female who is orally intubated and in no distress  Neuro: Nodding appropriately to questions, moving all fours, pupils equal, face symmetric  HEENT: No overt JVD Cardiovascular: S1 and S2 are now regular without  murmur rub or gallop.  She has no dependent edema.   Lungs: Symmetric air movement, respirations are unlabored, there are no wheezes, there is only fair air movement throughout there is not prolongation of the expiratory phase.   Abdomen: The abdomen is scaphoid and soft without any organomegaly masses tenderness guarding or rebound, bowel sounds are decreased  Musculoskeletal: There is no dependent edema, no obvious deformities Skin: Frail, warm dry and intact   RESOLVED PROBLEM LIST   ASSESSMENT AND PLAN   Acute on Chronic Respiratory  Failure 2/2 tension pneumothorax CXR 10/8 shows small residual pneumothorax CT with minimal drainage, no leak upon my inspection Weaning on 10/5, but required increase in FiO2 + 3.5 L  Plan Adjust FiO2 and PEEP as needed Continue weaning Lasix 40 mg x 1  Anticipating extubation after lasix and mag Will need swallow evaluation Maintain sats 88-92% Trend CXR Continue Scheduled BD Aggressive pulmonary toilet  Elevated Troponin Bump to 4.32 on 10/8 EKG changes ??  2/2 tension pneumo Echo with EF 25-30%, Pattern is suggestive of stress (Takotsubo) cardiomyopathy versus severe multivessel coronary disease. Moderate diastolic dysfunction.   Plan: Appreciate cards input Continue ASA, Statin, Heparin Per cards note not candidate for cath Trend BNP Trend troponin until clear Lasix 40 x 1  Check lactate to ensure resolution Will add low dose coreg  Renal No acute issues Plan Trend BMET Replete electrolytes as needed  ID New low grade fever T Max 101 WBC bump  From 8.6 to 16.5 last 24 CXR without infiltrate Plan: Sputum Culture Urine Culture Trend CBC/ Fever curve Trend blood cultures / micro Culture as is clinically indicated Monitor off ABX for now Check PCT  GI/ Nutrition Pt is cachectic. FTT Body mass index is 16.77 kg/m. Plan: Will need swallow evaluation and regular diet post extubation Meal Supplements If unable to extubate, start Vital AF 1.2 at 15 ml/hr and increase by 10 ml every 12 hours to goal rate of 45 ml/hr Appreciate dietitian assist SUP  Goals of Care Per cards note 10/7, patient is not good candidate for cath. Need goals of care discussion after extubation as patient is alert and appropriate at present and should be able to make her own decision. This will allow for alignment of goals of care with plan of care   SUMMARY OF TODAY'S PLAN:  This is a 77 year old with advanced COPD who presented with acute dyspnea and desaturation was found to  have a right tension pneumothorax.  In addition she had ST elevations consistent with a RCA territory infarct.  Her echocardiogram has shown severely compromised LV systolic function.  Pertinent to her coronary disease she is continuing on aspirin, statin, and full dose heparin for now.  We will be treating her acute inflammatory state which is likely secondary to aspiration pneumonia before attempting the addition of diuretics and afterload reduction.  In addition the patient has a fever with most likely provocation being aspiration.  At present we have no positive cultures and going to continue her on empiric Zosyn. Chest x-ray this morning shows essentially complete resolution of her pneumothorax and there is no leak from the chest tube. The patient is cachectic at baseline and I have asked that we initiate tube feedings today.  Best Practice / Goals of Care / Disposition.   DVT PROPHYLAXIS: Systemic dose heparin EPP:[id number] NUTRITION: Anticipating extubation n.p.o. nutrition MOBILITY:BR/ OOB to chair GOALS OF CARE: To be discussed with family when they arrive later today LABS  Glucose  Recent Labs  Lab 05/24/18 1629 05/24/18 1924 05/24/18 2037 05/25/18 0022 05/25/18 0456 05/25/18 0743  GLUCAP 201* 190* 221* 116* 111* 116*    BMET Recent Labs  Lab 05/23/18 0107 05/24/18 0213 05/24/18 1248 05/25/18 0500  NA 138 142  --  137  K 2.8* 4.1 3.4* 3.4*  CL 96* 110  --  104  CO2 31 25  --  27  BUN 22 18  --  22  CREATININE 0.68 0.33*  --  0.43*  GLUCOSE 276* 109*  --  129*    Liver Enzymes Recent Labs  Lab 05/23/18 0107 05/25/18 0500  AST 21 30  ALT 15 25  ALKPHOS 55 48  BILITOT 0.8 1.0  ALBUMIN 3.5 2.5*    Electrolytes Recent Labs  Lab 05/23/18 0107 05/24/18 0213 05/24/18 1248 05/25/18 0500  CALCIUM 8.9 8.4*  --  8.2*  MG 3.7* 1.9 1.8 2.5*    CBC Recent Labs  Lab 05/23/18 0107 05/25/18 0500  WBC 16.5* 25.7*  HGB 13.7 10.6*  HCT 46.2* 34.1*  PLT 279  256    ABG Recent Labs  Lab 05/24/18 0334 05/24/18 1258 05/24/18 1308  PHART 7.361 7.393 7.357  PCO2ART 48.6* 49.5* 50.4*  PO2ART 74.0* 46.0* 58.0*    Coag's Recent Labs  Lab 05/23/18 0107  APTT 20*  INR 0.92    Sepsis Markers Recent Labs  Lab 05/23/18 0107  05/23/18 0919 05/24/18 1059 05/25/18 0500  LATICACIDVEN  --    < > 4.1* 1.3 1.6  PROCALCITON <0.10  --   --  1.41  --    < > = values in this interval not displayed.    Cardiac Enzymes Recent Labs  Lab 05/23/18 1908 05/24/18 1248 05/25/18 0500  TROPONINI 4.32* 3.99* 1.84*    PAST MEDICAL HISTORY :   She  has a past medical history of CHF (congestive heart failure) (HCC) and COPD (chronic obstructive pulmonary disease) ([place]).  PAST SURGICAL HISTORY:  She  has no past surgical history on file.  Allergies  Allergen Reactions  . Erythromycin Anaphylaxis  . Amoxicillin Other (See Comments)    Not sure  . Amoxicillin-Pot Clavulanate Other (See Comments)    Not sure  . Prednisone     Elevates bp  . Spiriva Handihaler [Tiotropium Bromide Monohydrate]     No current facility-administered medications on file prior to encounter.    Current Outpatient Medications on File Prior to Encounter  Medication Sig  . acetaminophen (TYLENOL) 500 MG tablet Take 500 mg by mouth every 6 (six) hours as needed for mild pain.  . hydrochlorothiazide (HYDRODIURIL) 25 MG tablet Take 25 mg by mouth daily.  Marland Kitchen UNABLE TO FIND Med Name: DME- [facility]  Oxygen 2lpm 24/7    FAMILY HISTORY:   Her family history includes Diabetes in her sister; Hypertension in her father and sister.  SOCIAL HISTORY:  She  reports that she quit smoking about 6 years ago. Her smoking use included cigarettes. She has a 49.00 pack-year smoking history. She has never used smokeless tobacco. She reports that she does not drink alcohol or use drugs.     Lars Masson, MD 8:34 AM   Greater than 32 minutes was spent in the care of this acutely ill patient  today

## 2018-05-25 NOTE — Progress Notes (Signed)
ANTICOAGULATION CONSULT NOTE - Follow Up Consult  Pharmacy Consult for Heparin Indication: chest pain/ACS  Allergies  Allergen Reactions  . Erythromycin Anaphylaxis  . Amoxicillin Other (See Comments)    Not sure  . Amoxicillin-Pot Clavulanate Other (See Comments)    Not sure  . Prednisone     Elevates bp  . Spiriva Handihaler [Tiotropium Bromide Monohydrate]     Patient Measurements: Height: 5\' 5"  (165.1 cm) Weight: 100 lb 12 oz (45.7 kg) IBW/kg (Calculated) : 57 Heparin Dosing Weight: 39.7 kg  Vital Signs: Temp: 99.9 F (37.7 C) (10/09 0900) Temp Source: Oral (10/09 0800) BP: 123/62 (10/09 0900) Pulse Rate: 69 (10/09 0900)  Labs: Recent Labs    05/23/18 0107  05/23/18 1908 05/24/18 0213 05/24/18 1248 05/25/18 0500  HGB 13.7  --   --   --   --  10.6*  HCT 46.2*  --   --   --   --  34.1*  PLT 279  --   --   --   --  256  APTT 20*  --   --   --   --   --   LABPROT 12.3  --   --   --   --   --   INR 0.92  --   --   --   --   --   HEPARINUNFRC  --    < > 0.40 0.39  --  0.36  CREATININE 0.68  --   --  0.33*  --  0.43*  TROPONINI 0.09*   < > 4.32*  --  3.99* 1.84*   < > = values in this interval not displayed.    Estimated Creatinine Clearance: 43.8 mL/min (A) (by C-G formula based on SCr of 0.43 mg/dL (L)).   Assessment: 77 yo F on heparin for chest pain/ACS. Initially presented with SOB requiring intubation and found to have R sided PTX requiring chest tube insertion. Not a candidate for cath at this time due to current clinical status.   HL (0.43) therapeutic on 650 units/hr. Drop in Hgb (13.7>>10.6) and HCT (46.2>>34.1). PLTs WNL but dropped from 279>>256. No signs of bleeding or infusion issues per nursing. Will continue heparin per CCM as pt is receiving a lot of fluids and had runs of SVT yesterday.   Goal of Therapy:  Heparin level 0.3-0.7 units/ml Monitor platelets by anticoagulation protocol: Yes   Plan:  - Continue heparin 650 u/hr - Continue to  monitor daily anti-Xa levels, CBC, and signs of bleeding - Re-assess need of heparin tomorrow (10/10) as it has been >48 hr since initiation and pt not going to cath  Richardine Service, PharmD Candidate 05/25/2018,10:12 AM

## 2018-05-25 NOTE — Evaluation (Signed)
SLP Cancellation Note  Patient Details Name: Copper Kirtley MRN: [id number] DOB: 1940/12/25   Cancelled treatment:       Reason Eval/Treat Not Completed: Medical issues which prohibited therapy(pt remains intubated)   Macario Golds 05/25/2018, 7:31 AM  Luanna Salk, MS [facility] SLP Acute Rehab Services Pager [phone] Office [phone]

## 2018-05-25 NOTE — Progress Notes (Signed)
Pigtail CT flushed with 69ml sterile normal saline at 1730 per order.

## 2018-05-25 NOTE — Progress Notes (Addendum)
Nutrition Consult/Follow Up  DOCUMENTATION CODES:   Severe malnutrition in context of chronic illness, Underweight  INTERVENTION:    Initiate Vital AF 1.2 at 15 ml/hr and increase by 10 ml every 12 hours to goal rate of 45 ml/hr  Provides 1296 kcals, 81 gm protein, 875 ml of free water   Monitor magnesium, potassium, and phosphorus daily for at least 3 days, MD to replete as needed, as pt is at risk for refeeding syndrome   NEW NUTRITION DIAGNOSIS:   Severe Malnutrition related to chronic illness(severe COPD) as evidenced by severe fat depletion, severe muscle depletion, ongoing  GOAL:   Patient will meet greater than or equal to 90% of their needs, progressing   MONITOR:   Vent status, TF tolerance, Labs, Skin, Weight trends, I & O's  ASSESSMENT:   77 yo Female with advanced COPD who presented with acute dyspnea and desaturation was found to have a right tension pneumothorax.  Patient is currently intubated on ventilator support MV: 10.2 L/min Temp (24hrs), Avg:100.2 F (37.9 C), Min:98.4 F (36.9 C), Max:101.7 F (38.7 C)  NGT in place  RD consulted for enteral/TF initiation & management.  Patient anxious upon visit. Has safety mittens on. CVC placed 10/8 for infusion of pressors.  Labs reviewed. Mg 2.5 (H). K 3.4 (L). Medications include Precedex.  CBG's 111-116-100.  Spoke with Urban Gibson, RN regarding nutrition care plan.  NUTRITION - FOCUSED PHYSICAL EXAM:    Most Recent Value  Orbital Region  Moderate depletion  Upper Arm Region  Severe depletion  Thoracic and Lumbar Region  Unable to assess  Buccal Region  Moderate depletion  Temple Region  Moderate depletion  Clavicle Bone Region  Severe depletion  Clavicle and Acromion Bone Region  Severe depletion  Scapular Bone Region  Unable to assess  Dorsal Hand  Unable to assess [mittens]  Patellar Region  Severe depletion  Anterior Thigh Region  Severe depletion  Posterior Calf Region  Severe depletion   Edema (RD Assessment)  None     Diet Order:   Diet Order            Diet NPO time specified  Diet effective now             EDUCATION NEEDS:   Not appropriate for education at this time  Skin:  Skin Assessment: Skin Integrity Issues: Skin Integrity Issues:: Stage I Stage I: bilateral heels  Last BM:  PTA    Intake/Output Summary (Last 24 hours) at 05/25/2018 1258 Last data filed at 05/25/2018 1200 Gross per 24 hour  Intake 3207.78 ml  Output 530 ml  Net 2677.78 ml   Height:   Ht Readings from Last 1 Encounters:  05/23/18 5\' 5"  (1.651 m)   Weight:   Wt Readings from Last 1 Encounters:  05/25/18 45.7 kg   Ideal Body Weight:  56.8 kg  BMI:  Body mass index is 16.77 kg/m.  Estimated Nutritional Needs:   Kcal:  1388  Protein:  75-90 gm  Fluid:  per MD  Arthur Holms, RD, LDN Pager #: [phone] After-Hours Pager #: [phone]

## 2018-05-26 LAB — CBC WITH DIFFERENTIAL/PLATELET
BASOS ABS: 0 10*3/uL (ref 0.0–0.1)
Basophils Relative: 0 %
Eosinophils Absolute: 0 10*3/uL (ref 0.0–0.5)
Eosinophils Relative: 0 %
HCT: 30.7 % — ABNORMAL LOW (ref 36.0–46.0)
HEMOGLOBIN: 9.6 g/dL — AB (ref 12.0–15.0)
LYMPHS ABS: 0.9 10*3/uL (ref 0.7–4.0)
LYMPHS PCT: 4 %
MCH: 29 pg (ref 26.0–34.0)
MCHC: 31.3 g/dL (ref 30.0–36.0)
MCV: 92.7 fL (ref 80.0–100.0)
Monocytes Absolute: 0.9 10*3/uL (ref 0.1–1.0)
Monocytes Relative: 4 %
NRBC: 0 % (ref 0.0–0.2)
NRBC: 0 /100{WBCs}
Neutro Abs: 21.2 10*3/uL — ABNORMAL HIGH (ref 1.7–7.7)
Neutrophils Relative %: 92 %
PLATELETS: 216 10*3/uL (ref 150–400)
RBC: 3.31 MIL/uL — AB (ref 3.87–5.11)
RDW: 14.5 % (ref 11.5–15.5)
WBC: 23 10*3/uL — AB (ref 4.0–10.5)

## 2018-05-26 LAB — GLUCOSE, CAPILLARY
GLUCOSE-CAPILLARY: 112 mg/dL — AB (ref 70–99)
GLUCOSE-CAPILLARY: 114 mg/dL — AB (ref 70–99)
Glucose-Capillary: 134 mg/dL — ABNORMAL HIGH (ref 70–99)
Glucose-Capillary: 97 mg/dL (ref 70–99)
Glucose-Capillary: 118 mg/dL — ABNORMAL HIGH (ref 70–99)
Glucose-Capillary: 107 mg/dL — ABNORMAL HIGH (ref 70–99)

## 2018-05-26 LAB — PHOSPHORUS
PHOSPHORUS: 2.1 mg/dL — AB (ref 2.5–4.6)
Phosphorus: 1.7 mg/dL — ABNORMAL LOW (ref 2.5–4.6)

## 2018-05-26 LAB — BASIC METABOLIC PANEL
ANION GAP: 7 (ref 5–15)
BUN: 24 mg/dL — ABNORMAL HIGH (ref 8–23)
CO2: 25 mmol/L (ref 22–32)
Calcium: 8.4 mg/dL — ABNORMAL LOW (ref 8.9–10.3)
Chloride: 106 mmol/L (ref 98–111)
Creatinine, Ser: 0.35 mg/dL — ABNORMAL LOW (ref 0.44–1.00)
GFR calc Af Amer: >60 mL/min (ref >60)
GFR calc non Af Amer: >60 mL/min (ref >60)
Glucose, Bld: 156 mg/dL — ABNORMAL HIGH (ref 70–99)
Potassium: 3.7 mmol/L (ref 3.5–5.1)
Sodium: 138 mmol/L (ref 135–145)

## 2018-05-26 LAB — BLOOD GAS, ARTERIAL
Acid-Base Excess: 2.3 mmol/L — ABNORMAL HIGH (ref 0.0–2.0)
BICARBONATE: 26.6 mmol/L (ref 20.0–28.0)
Drawn by: 24910
FIO2: 40
LHR: 20 {breaths}/min
MECHVT: 470 mL
O2 Saturation: 88.1 %
PATIENT TEMPERATURE: 98.6
PCO2 ART: 43.6 mmHg (ref 32.0–48.0)
PEEP: 8 cmH2O
PO2 ART: 57.9 mmHg — AB (ref 83.0–108.0)
pH, Arterial: 7.403 (ref 7.350–7.450)

## 2018-05-26 LAB — HEPARIN LEVEL (UNFRACTIONATED): Heparin Unfractionated: 0.22 IU/mL — ABNORMAL LOW (ref 0.30–0.70)

## 2018-05-26 MED ORDER — HEPARIN SODIUM (PORCINE) 5000 UNIT/ML IJ SOLN
5000.0000 [IU] | Freq: Two times a day (BID) | INTRAMUSCULAR | Status: DC
  Administered 2018-05-26 – 2018-06-02 (×16): 5000 [IU] via SUBCUTANEOUS
  Filled 2018-05-26 (×16): qty 1

## 2018-05-26 MED ORDER — AMIODARONE HCL 200 MG PO TABS
200.0000 mg | ORAL_TABLET | Freq: Two times a day (BID) | ORAL | Status: DC
  Administered 2018-05-26 – 2018-06-02 (×16): 200 mg via ORAL
  Filled 2018-05-26 (×16): qty 1

## 2018-05-26 MED ORDER — POTASSIUM & SODIUM PHOSPHATES 280-160-250 MG PO PACK
1.0000 | PACK | Freq: Once | ORAL | Status: AC
  Administered 2018-05-26: 1 via ORAL
  Filled 2018-05-26: qty 1

## 2018-05-26 MED ORDER — FUROSEMIDE 10 MG/ML IJ SOLN
10.0000 mg | Freq: Two times a day (BID) | INTRAMUSCULAR | Status: DC
  Administered 2018-05-26 – 2018-05-27 (×3): 10 mg via INTRAVENOUS
  Filled 2018-05-26 (×3): qty 2

## 2018-05-26 NOTE — Progress Notes (Signed)
  Speech Language Pathology Patient Details Name: Angel Hinton MRN: [id number] DOB: May 11, 1941 Today's Date: 05/26/2018 Time:  -     Intubated. Will follow along for extubation for swallow assessment.                GO                [name] 05/26/2018, 7:48 AM   [name] Colvin Caroli.Ed Risk analyst [phone] Office [phone]

## 2018-05-26 NOTE — Progress Notes (Signed)
RT note-No changes at this time per Dr.Gray, trying to diurese today, continue to monitor.

## 2018-05-26 NOTE — Progress Notes (Signed)
RT note- Called by RN for decrease in sp02, switch back to full support.

## 2018-05-26 NOTE — Progress Notes (Signed)
Paged and spoke with MD Pearline Cables to inform of phosphorous value 1.7, new orders received.

## 2018-05-26 NOTE — Progress Notes (Signed)
RT note-Increased fio2 to 80% due to low sp02, Nelsor pulse ox placed improved wave form and reading, continue to monitor.

## 2018-05-26 NOTE — Progress Notes (Signed)
Night shift RN reported decreased urine output and issues with leaking around foley.  Trilby Drummer RN advised foley flushes easily and was repositioned but still having issues with leaking around the foley.  During bedside report, patient was found to be wet from urine.  Foley catheter removed and purewick placed.

## 2018-05-26 NOTE — Progress Notes (Signed)
PULMONARY / CRITICAL CARE MEDICINE   NAME:  Angel Hinton, MRN:  [id number], DOB:  1941-06-27, LOS: 3 ADMISSION DATE:  05/23/2018, CONSULTATION DATE:  REFERRING MD:  ED, CHIEF COMPLAINT: Dyspnea  BRIEF HISTORY:         This is a 77 year old with severe COPD at baseline who presented with dyspnea and desaturation.  She was found to have EKG changes consistent with an acute inferior myocardial infarction but due to her concurrent morbidities it was elected not to perform cardiac catheterization.  She was intubated and found to have a tension pneumothorax.  A chest tube has been placed.  Initial troponin was only 0.09, . HISTORY OF PRESENT ILLNESS        77 yr old female w/ PMHx of severe COPD (last FEV1 37%, DLCO 27) not compliant with any bronchodilators, On home oxygen last seen in Pulmonary clinic 07/2017. Presents with SOB, Sats 38%-> increased to 72% on 4L  with no complaint of chest pain. Found on EKG to have ST elevations in the inferior and precordial leads.   Troponin on presentation rose to in excess of 5.  She was also found to have a tension pneumothorax and following instant intubation a right-sided pigtail was placed.  She subsequently developed fevers and leukocytosis with an elevated procalcitonin.  She has been started empirically on Zosyn.  On 10/8 she had an episode of SVT associated with hypotension and she was started on amiodarone infusion.  Past Medical History   Past Medical History:  Diagnosis Date  . CHF (congestive heart failure) ([place])   . COPD (chronic obstructive pulmonary disease) (H[facility])     SIGNIFICANT PAST MEDICAL HISTORY   Severe COPD, CHF, coronary hypertension  SIGNIFICANT EVENTS:  Intubated 10/7 STUDIES:   Echo 10/7>> Normal LV size with EF 25-30%. Wall motion abnormalities as noted   above. Pattern is suggestive of stress (Takotsubo) cardiomyopathy   versus severe multivessel coronary disease. Moderate diastolic   dysfunction. Normal RV size and  systolic function. Dilated IVC   suggestive of elevated RV filling pressure. Pulmonary arteries: PA peak pressure: 39 mm Hg (S). CULTURES:  10/7: Respiratory culture pending 10/7>> MRSA PCR >> Neg 10/7 Blood Culture >> Pending  ANTIBIOTICS:  Zosyn   LINES/TUBES:   Pigtail placed 10/7 CONSULTANTS:  Cardiology 10/7 SUBJECTIVE:  No arrhythmias overnight. Off pressors. Sedated but nodding to questions.    CONSTITUTIONAL: BP (!) 113/52   Pulse (!) 59   Temp 98.2 F (36.8 C) (Oral)   Resp 19   Ht 5\' 5"  (1.651 m)   Wt 47.3 kg   SpO2 96%   BMI 17.35 kg/m   I/O last 3 completed shifts: In: 3495.2 [I.V.:3088.9; NG/GT:2.8; IV Piggyback:403.5] Out: 655 [Urine:625; Chest Tube:30]     Vent Mode: PRVC FiO2 (%):  [40 %] 40 % Set Rate:  [20 bmp] 20 bmp Vt Set:  [470 mL] 470 mL PEEP:  [8 cmH20] 8 cmH20 Plateau Pressure:  [20 UKG25-42 cmH20] 20 cmH20  PHYSICAL EXAM: General: Thin cachectic elderly female who is orally intubated and in no distress.   Neuro: Nods to questions and spontaneously moves all fours.   HEENT: No overt JVD Cardiovascular: S1 and S2 are regular without murmur rub or gallop.  There is trace anasarca.     Lungs: There is symmetric air movement, few scattered rhonchi and no wheezes.  Respirations are unlabored.  There is no air leak from the chest tube.     Abdomen: Abdomen is scaphoid without  any organomegaly masses tenderness guarding or rebound  Musculoskeletal: Trace anasarca  Skin: Frail, warm dry and intact   RESOLVED PROBLEM LIST   ASSESSMENT AND PLAN   Acute on Chronic Respiratory Failure 2/2 tension pneumothorax CXR 10/8 shows small residual pneumothorax CT with minimal drainage, no leak upon my inspection Weaning on 10/5, but required increase in FiO2 + 3.5 L  Plan Adjust FiO2 and PEEP as needed Continue weaning Lasix 40 mg x 1  Anticipating extubation after lasix and mag Will need swallow evaluation Maintain sats 88-92% Trend  CXR Continue Scheduled BD Aggressive pulmonary toilet  Elevated Troponin Bump to 4.32 on 10/8 EKG changes ??  2/2 tension pneumo Echo with EF 25-30%, Pattern is suggestive of stress (Takotsubo) cardiomyopathy versus severe multivessel coronary disease. Moderate diastolic dysfunction.   Plan: Appreciate cards input Continue ASA, Statin, Heparin Per cards note not candidate for cath Trend BNP Trend troponin until clear Lasix 40 x 1  Check lactate to ensure resolution Will add low dose coreg  Renal No acute issues Plan Trend BMET Replete electrolytes as needed  ID New low grade fever T Max 101 WBC bump  From 8.6 to 16.5 last 24 CXR without infiltrate Plan: Sputum Culture Urine Culture Trend CBC/ Fever curve Trend blood cultures / micro Culture as is clinically indicated Monitor off ABX for now Check PCT  GI/ Nutrition Pt is cachectic. FTT Body mass index is 17.35 kg/m. Plan: Will need swallow evaluation and regular diet post extubation Meal Supplements If unable to extubate, start Vital AF 1.2 at 15 ml/hr and increase by 10 ml every 12 hours to goal rate of 45 ml/hr Appreciate dietitian assist SUP  Goals of Care Per cards note 10/7, patient is not good candidate for cath. Need goals of care discussion after extubation as patient is alert and appropriate at present and should be able to make her own decision. This will allow for alignment of goals of care with plan of care   SUMMARY OF TODAY'S PLAN:  This is a 77 year old with advanced COPD who presented with acute dyspnea and desaturation was found to have a right tension pneumothorax.  In addition she had ST elevations consistent with a RCA territory infarct.  Her echocardiogram has shown severely compromised LV systolic function.  Pertinent to her coronary disease she is continuing on aspirin, statin, and full dose heparin for now.  We will are treating her acute inflammatory state which is likely secondary to  aspiration pneumonia with Zosyn.  Now that she has been weaned off pressors I am going to very gently attempt to reintroduce diuretics.    She has had no further arrhythmias, I am going to be switching her to enteral amiodarone and discontinuing her heparin.   She is fairly easy to oxygenate and ventilate however her hemodynamics remain marginal I am not going to push rapidly toward separation from mechanical ventilation.  I suspect that her chances of a successful extubation will improve if we are able to diurese her  Best Practice / Goals of Care / Disposition.   DVT PROPHYLAXIS: Systemic dose heparin ZSW:FUXNATFT NUTRITION: Anticipating extubation n.p.o. nutrition MOBILITY:BR/ OOB to chair GOALS OF CARE: To be discussed with family when they arrive later today LABS  Glucose Recent Labs  Lab 05/25/18 0743 05/25/18 1125 05/25/18 1534 05/25/18 2011 05/25/18 2349 05/26/18 0451  GLUCAP 116* 100* 140* 90 112* 134*    BMET Recent Labs  Lab 05/25/18 0500 05/25/18 1855 05/26/18 0446  NA 137 141  138  K 3.4* 3.7 3.7  CL 104 108 106  CO2 27 27 25   BUN 22 23 24*  CREATININE 0.43* 0.41* 0.35*  GLUCOSE 129* 104* 156*    Liver Enzymes Recent Labs  Lab 05/23/18 0107 05/25/18 0500  AST 21 30  ALT 15 25  ALKPHOS 55 48  BILITOT 0.8 1.0  ALBUMIN 3.5 2.5*    Electrolytes Recent Labs  Lab 05/24/18 1248 05/25/18 0500 05/25/18 1855 05/26/18 0446  CALCIUM  --  8.2* 8.4* 8.4*  MG 1.8 2.5* 2.2  --   PHOS  --   --  2.1* 2.1*    CBC Recent Labs  Lab 05/23/18 0107 05/25/18 0500 05/26/18 0446  WBC 16.5* 25.7* 23.0*  HGB 13.7 10.6* 9.6*  HCT 46.2* 34.1* 30.7*  PLT 279 256 216    ABG Recent Labs  Lab 05/24/18 1258 05/24/18 1308 05/26/18 0600  PHART 7.393 7.357 7.403  PCO2ART 49.5* 50.4* 43.6  PO2ART 46.0* 58.0* 57.9*    Coag's Recent Labs  Lab 05/23/18 0107  APTT 20*  INR 0.92    Sepsis Markers Recent Labs  Lab 05/23/18 0107  05/23/18 0919  05/24/18 1059 05/25/18 0500  LATICACIDVEN  --    < > 4.1* 1.3 1.6  PROCALCITON <0.10  --   --  1.41 3.30   < > = values in this interval not displayed.    Cardiac Enzymes Recent Labs  Lab 05/23/18 1908 05/24/18 1248 05/25/18 0500  TROPONINI 4.32* 3.99* 1.84*    PAST MEDICAL HISTORY :   She  has a past medical history of CHF (congestive heart failure) ([facility]) and COPD (chronic obstructive pulmonary disease) ([place]).  PAST SURGICAL HISTORY:  She  has no past surgical history on file.  Allergies  Allergen Reactions  . Erythromycin Anaphylaxis  . Amoxicillin Other (See Comments)    Not sure  . Amoxicillin-Pot Clavulanate Other (See Comments)    Not sure  . Prednisone     Elevates bp  . Spiriva Handihaler [Tiotropium Bromide Monohydrate]     No current facility-administered medications on file prior to encounter.    Current Outpatient Medications on File Prior to Encounter  Medication Sig  . acetaminophen (TYLENOL) 500 MG tablet Take 500 mg by mouth every 6 (six) hours as needed for mild pain.  . hydrochlorothiazide (HYDRODIURIL) 25 MG tablet Take 25 mg by mouth daily.  Marland Kitchen UNABLE TO FIND Med Name: DME- [facility]  Oxygen 2lpm 24/7    FAMILY HISTORY:   Her family history includes Diabetes in her sister; Hypertension in her father and sister.  SOCIAL HISTORY:  She  reports that she quit smoking about 6 years ago. Her smoking use included cigarettes. She has a 49.00 pack-year smoking history. She has never used smokeless tobacco. She reports that she does not drink alcohol or use drugs.     Lars Masson, MD 7:50 AM

## 2018-05-26 NOTE — Progress Notes (Signed)
RT note-Dr.Gray paged concerning fio2 requirements.

## 2018-05-27 ENCOUNTER — Inpatient Hospital Stay (HOSPITAL_COMMUNITY): Payer: Medicare Other

## 2018-05-27 LAB — GLUCOSE, CAPILLARY
GLUCOSE-CAPILLARY: 126 mg/dL — AB (ref 70–99)
GLUCOSE-CAPILLARY: 132 mg/dL — AB (ref 70–99)
GLUCOSE-CAPILLARY: 90 mg/dL (ref 70–99)
GLUCOSE-CAPILLARY: 106 mg/dL — AB (ref 70–99)
Glucose-Capillary: 75 mg/dL (ref 70–99)
Glucose-Capillary: 132 mg/dL — ABNORMAL HIGH (ref 70–99)
Glucose-Capillary: 106 mg/dL — ABNORMAL HIGH (ref 70–99)

## 2018-05-27 LAB — BASIC METABOLIC PANEL
Anion gap: 7 (ref 5–15)
BUN: 22 mg/dL (ref 8–23)
CALCIUM: 8.4 mg/dL — AB (ref 8.9–10.3)
CO2: 30 mmol/L (ref 22–32)
Chloride: 104 mmol/L (ref 98–111)
Creatinine, Ser: 0.37 mg/dL — ABNORMAL LOW (ref 0.44–1.00)
GFR calc Af Amer: >60 mL/min (ref >60)
GLUCOSE: 136 mg/dL — AB (ref 70–99)
Potassium: 3.3 mmol/L — ABNORMAL LOW (ref 3.5–5.1)
SODIUM: 141 mmol/L (ref 135–145)

## 2018-05-27 LAB — BLOOD GAS, ARTERIAL
Acid-Base Excess: 5.2 mmol/L — ABNORMAL HIGH (ref 0.0–2.0)
Bicarbonate: 29.6 mmol/L — ABNORMAL HIGH (ref 20.0–28.0)
DRAWN BY: 51155
FIO2: 50
MECHVT: 470 mL
O2 Saturation: 98 %
PEEP: 8 cmH2O
PO2 ART: 104 mmHg (ref 83.0–108.0)
Patient temperature: 98.6
RATE: 20 resp/min
pCO2 arterial: 46.3 mmHg (ref 32.0–48.0)
pH, Arterial: 7.421 (ref 7.350–7.450)

## 2018-05-27 LAB — CBC WITH DIFFERENTIAL/PLATELET
Abs Immature Granulocytes: 0.11 10*3/uL — ABNORMAL HIGH (ref 0.00–0.07)
BASOS PCT: 0 %
Basophils Absolute: 0 10*3/uL (ref 0.0–0.1)
EOS ABS: 0 10*3/uL (ref 0.0–0.5)
Eosinophils Relative: 0 %
HCT: 29.6 % — ABNORMAL LOW (ref 36.0–46.0)
Hemoglobin: 9.2 g/dL — ABNORMAL LOW (ref 12.0–15.0)
IMMATURE GRANULOCYTES: 1 %
LYMPHS PCT: 4 %
Lymphs Abs: 0.6 10*3/uL — ABNORMAL LOW (ref 0.7–4.0)
MCH: 28.8 pg (ref 26.0–34.0)
MCHC: 31.1 g/dL (ref 30.0–36.0)
MCV: 92.8 fL (ref 80.0–100.0)
MONO ABS: 1.3 10*3/uL — AB (ref 0.1–1.0)
MONOS PCT: 7 %
NEUTROS ABS: 15.7 10*3/uL — AB (ref 1.7–7.7)
NEUTROS PCT: 88 %
PLATELETS: 240 10*3/uL (ref 150–400)
RBC: 3.19 MIL/uL — AB (ref 3.87–5.11)
RDW: 14.6 % (ref 11.5–15.5)
WBC: 17.8 10*3/uL — ABNORMAL HIGH (ref 4.0–10.5)
nRBC: 0 % (ref 0.0–0.2)

## 2018-05-27 LAB — MAGNESIUM: MAGNESIUM: 2 mg/dL (ref 1.7–2.4)

## 2018-05-27 MED ORDER — POTASSIUM CHLORIDE 10 MEQ/50ML IV SOLN: 10.0000 meq | INTRAVENOUS | Status: DC

## 2018-05-27 MED ORDER — ALPRAZOLAM 0.25 MG PO TABS
0.2500 mg | ORAL_TABLET | Freq: Three times a day (TID) | ORAL | Status: DC
  Administered 2018-05-27 – 2018-06-02 (×21): 0.25 mg
  Filled 2018-05-27 (×21): qty 1

## 2018-05-27 MED ORDER — POTASSIUM PHOSPHATES 15 MMOLE/5ML IV SOLN
30.0000 mmol | Freq: Once | INTRAVENOUS | Status: AC
  Administered 2018-05-27: 30 mmol via INTRAVENOUS
  Filled 2018-05-27: qty 10

## 2018-05-27 MED ORDER — POTASSIUM CHLORIDE 20 MEQ PO PACK
20.0000 meq | PACK | Freq: Two times a day (BID) | ORAL | Status: DC
  Administered 2018-05-27 – 2018-06-02 (×13): 20 meq
  Filled 2018-05-27 (×13): qty 1

## 2018-05-27 MED ORDER — POTASSIUM CHLORIDE 10 MEQ/50ML IV SOLN
10.0000 meq | INTRAVENOUS | Status: AC
  Administered 2018-05-27 (×4): 10 meq via INTRAVENOUS
  Filled 2018-05-27 (×4): qty 50

## 2018-05-27 MED ORDER — FUROSEMIDE 10 MG/ML IJ SOLN
20.0000 mg | Freq: Two times a day (BID) | INTRAMUSCULAR | Status: DC
  Administered 2018-05-27 – 2018-05-28 (×2): 20 mg via INTRAVENOUS
  Filled 2018-05-27 (×2): qty 2

## 2018-05-27 NOTE — Progress Notes (Addendum)
Nutrition Follow Up  DOCUMENTATION CODES:   Severe malnutrition in context of chronic illness, Underweight  INTERVENTION:    Continue Vital AF 1.2 at goal rate of 45 ml/hr  Provides 1296 kcals, 81 gm protein, 875 ml of free water  NUTRITION DIAGNOSIS:   Severe Malnutrition related to chronic illness(severe COPD) as evidenced by severe fat depletion, severe muscle depletion, ongoing  GOAL:   Patient will meet greater than or equal to 90% of their needs, met  MONITOR:   Vent status, TF tolerance, Labs, Skin, Weight trends, I & O's  ASSESSMENT:   77 yo Female with advanced COPD who presented with acute dyspnea and desaturation was found to have a right tension pneumothorax.  Patient remains intubated on ventilator support MV: 10.2 L/min Temp (24hrs), Avg:98.1 F (36.7 C), Min:97.5 F (36.4 C), Max:98.9 F (37.2 C)  Vital AF 1.2 currently infusing via NGT at goal rate of 45 ml/hr.  Spoke with RN, tolerating well.  PCCM note reviewed. Pt with episode of asystole last night.   Underlying CAD. Pt not a good candidate cardiac catheterization. Labs & medications reviewed. K 3.3 (L). Phos 1.7 (L). Mg WNL. CBG's [id number].  Diet Order:   Diet Order            Diet NPO time specified  Diet effective now             EDUCATION NEEDS:   Not appropriate for education at this time  Skin:  Skin Assessment: Skin Integrity Issues: Skin Integrity Issues:: Stage I Stage I: bilateral heels  Last BM:  10/10    Intake/Output Summary (Last 24 hours) at 05/27/2018 1259 Last data filed at 05/27/2018 1200 Gross per 24 hour  Intake 2726.15 ml  Output 380 ml  Net 2346.15 ml   Height:   Ht Readings from Last 1 Encounters:  05/23/18 5' 5"  (1.651 m)   Weight:   Wt Readings from Last 1 Encounters:  05/27/18 47.6 kg   Ideal Body Weight:  56.8 kg  BMI:  Body mass index is 17.46 kg/m.  Estimated Nutritional Needs:   Kcal:  1254  Protein:  75-90 gm  Fluid:  per  MD  Arthur Holms, RD, LDN Pager #: [phone] After-Hours Pager #: [phone]

## 2018-05-27 NOTE — Plan of Care (Signed)
  Problem: Safety: Goal: Ability to remain free from injury will improve Outcome: Progressing   Problem: Skin Integrity: Goal: Risk for impaired skin integrity will decrease Outcome: Progressing   Problem: Role Relationship: Goal: Method of communication will improve Outcome: Progressing Note:  Pt able to write. Delirious at times though.   Problem: Activity: Goal: Ability to tolerate increased activity will improve Outcome: Not Progressing Note:  Pt remains intubated and sedated   Problem: Respiratory: Goal: Ability to maintain a clear airway and adequate ventilation will improve Outcome: Not Progressing Note:  Unable to consistently wean FIO2 at this time

## 2018-05-27 NOTE — Progress Notes (Signed)
Pt noted to have BP reading with a SBP 50's. Levo restarted. Pt went asystole. RN began CPR for approx 10 seconds. Pt began moving. ROSC achieved. Pt alert and nodding appropriately.  Pt began desating contacted respiratory who increased Fi02. Zoll pads placed prophylacticly.  BP dropping again. SB in the 30's. Levo again titrated.  Noted increase in WOB.  RN attempted to call sister, Hassan Rowan, with no answer.  K and phos noted to be low on AM labs. Orders placed per CCM.

## 2018-05-27 NOTE — Progress Notes (Signed)
Pharmacy Antibiotic Note  Angel Hinton is a 77 y.o. female admitted on 05/23/2018 with CP and SOB. Pharmacy has been consulted for Zosyn dosing due to possible aspiration, elevated WBC, and fevers.   WBC (17.8) elevated but trending down. Afebrile while receiving ASA 81. She is hypotensive requiring levophed. Scr (0.37), CrCl (45.7) and UOP stable. Cultures remain negative.  Plan: - Continue Zosyn 3.375g Q8h until 10/12 - Monitor cultures and renal function   Height: 5\' 5"  (165.1 cm) Weight: 104 lb 15 oz (47.6 kg) IBW/kg (Calculated) : 57  Temp (24hrs), Avg:98.2 F (36.8 C), Min:97.6 F (36.4 C), Max:98.9 F (37.2 C)  Recent Labs  Lab 05/23/18 0107 05/23/18 0551 05/23/18 0919 05/24/18 0213 05/24/18 1059 05/25/18 0500 05/25/18 1855 05/26/18 0446 05/27/18 0327  WBC 16.5*  --   --   --   --  25.7*  --  23.0* 17.8*  CREATININE 0.68  --   --  0.33*  --  0.43* 0.41* 0.35* 0.37*  LATICACIDVEN  --  4.8* 4.1*  --  1.3 1.6  --   --   --     Estimated Creatinine Clearance: 45.7 mL/min (A) (by C-G formula based on SCr of 0.37 mg/dL (L)).    Allergies  Allergen Reactions  . Erythromycin Anaphylaxis  . Amoxicillin Other (See Comments)    Not sure  . Amoxicillin-Pot Clavulanate Other (See Comments)    Not sure  . Prednisone     Elevates bp  . Spiriva Handihaler [Tiotropium Bromide Monohydrate]     Antimicrobials this admission: Zosyn 10/8>>(10/12)  Dose adjustments this admission: N/A  Microbiology results: 10/7 BCx: NG3D 10/8 UCx: neg 10/7 MRSA PCR: neg   Richardine Service, PharmD Candidate 05/27/2018 10:45 AM

## 2018-05-27 NOTE — Progress Notes (Signed)
Pt repeatedly removing mitts. Explained reason and need for mitts several times to patient. Pt continues to make several attempts to pull at tubes and lines. BSWR placed. WCTM.

## 2018-05-27 NOTE — Progress Notes (Signed)
[facility] Progress Note Patient Name: Angel Hinton DOB: Sep 01, 1940 MRN: [id number]   Date of Service  05/27/2018  HPI/Events of Note  Hypokalemia and hypophos  eICU Interventions  Potassium and Phos replaced     Intervention Category Intermediate Interventions: Electrolyte abnormality - evaluation and management  DETERDING,ELIZABETH 05/27/2018, 6:20 AM

## 2018-05-27 NOTE — Progress Notes (Signed)
Speech Language Pathology Discharge Patient Details Name: Angel Hinton MRN: [id number] DOB: Mar 26, 1941 Today's Date: 05/27/2018 Time:  -     Patient discharged from SLP services secondary to medical decline - will need to re-order SLP to resume therapy services. Remains intubated. Brief period asystole this am  Please see latest therapy progress note for current level of functioning and progress toward goals.    Progress and discharge plan discussed with patient and/or caregiver: Patient unable to participate in discharge planning and no caregivers available  GO     [name] 05/27/2018, 7:46 AM

## 2018-05-27 NOTE — Progress Notes (Signed)
PULMONARY / CRITICAL CARE MEDICINE   NAME:  Angel Hinton, MRN:  [id number], DOB:  1941-04-11, LOS: 4 ADMISSION DATE:  05/23/2018, CONSULTATION DATE:  REFERRING MD:  ED, CHIEF COMPLAINT: Dyspnea  BRIEF HISTORY:         This is a 77 year old with severe COPD at baseline who presented with dyspnea and desaturation.  She was found to have EKG changes consistent with an acute inferior myocardial infarction but due to her concurrent morbidities it was elected not to perform cardiac catheterization.  She was intubated and found to have a tension pneumothorax.  A chest tube has been placed.  Initial troponin was only 0.09, . HISTORY OF PRESENT ILLNESS        77 yr old female w/ PMHx of severe COPD (last FEV1 37%, DLCO 27) not compliant with any bronchodilators, On home oxygen last seen in Pulmonary clinic 07/2017. Presents with SOB, Sats 38%-> increased to 72% on 4L [place] with no complaint of chest pain. Found on EKG to have ST elevations in the inferior and precordial leads.   Troponin on presentation rose to in excess of 5.  Echo shows poor left ventricular function with an LV ejection fraction of 20 to 25%.  She was found to have a tension pneumothorax following  intubation and a right-sided pigtail was placed.  She subsequently developed fevers and leukocytosis with an elevated procalcitonin.  She has been started empirically on Zosyn.  On 10/8 she had an episode of SVT associated with hypotension and she was started on amiodarone infusion.  Past Medical History   Past Medical History:  Diagnosis Date  . CHF (congestive heart failure) ([place])   . COPD (chronic obstructive pulmonary disease) ([facility])     SIGNIFICANT PAST MEDICAL HISTORY   Severe COPD, CHF, coronary hypertension  SIGNIFICANT EVENTS:  Intubated 10/7 STUDIES:   Echo 10/7>> Normal LV size with EF 25-30%. Wall motion abnormalities as noted   above. Pattern is suggestive of stress (Takotsubo) cardiomyopathy   versus severe multivessel  coronary disease. Moderate diastolic   dysfunction. Normal RV size and systolic function. Dilated IVC   suggestive of elevated RV filling pressure. Pulmonary arteries: PA peak pressure: 39 mm Hg (S). CULTURES:  10/7: Respiratory culture pending 10/7>> MRSA PCR >> Neg 10/7 Blood Culture >> Pending  ANTIBIOTICS:  Zosyn   LINES/TUBES:   Pigtail placed 10/7 CONSULTANTS:  Cardiology 10/7 SUBJECTIVE:  Brief episode of asystole last night which responded to approximately 10 seconds of CPR.  She is sedated and not interactive during my examination this morning, she was transiently on Levophed this morning.    CONSTITUTIONAL: BP (!) 122/57   Pulse 67   Temp 97.6 F (36.4 C) (Oral)   Resp (!) 24   Ht 5\' 5"  (1.651 m)   Wt 47.6 kg   SpO2 100%   BMI 17.46 kg/m   I/O last 3 completed shifts: In: 2956.4 [I.V.:1816.5; Other:30; NG/GT:790.8; IV Piggyback:319.1] Out: 665 [Urine:605; Chest Tube:60]     Vent Mode: PRVC FiO2 (%):  [40 %-80 %] 70 % Set Rate:  [20 bmp] 20 bmp Vt Set:  [470 mL] 470 mL PEEP:  [8 cmH20] 8 cmH20 Plateau Pressure:  [20 cmH20-25 cmH20] 20 cmH20  PHYSICAL EXAM: General: Cachectic elderly female who is orally intubated and in no distress.     Neuro: Sedated and not interactive.     HEENT: No overt JVD  Cardiovascular: S1 and S2 are regular without murmur rub or gallop.  She has 1+  anasarca     Lungs: Symmetric air movement, few scattered rhonchi, no wheezes.  Operations are unlabored.  Respirations are unlabored.  There is no air leak from the chest tube.    Abdomen: The abdomen is scaphoid without any organomegaly masses tenderness guarding or rebound   Musculoskeletal: 1+  anasarca  Skin: Frail, warm dry and intact   RESOLVED PROBLEM LIST   ASSESSMENT AND PLAN   Acute on Chronic Respiratory Failure 2/2 tension pneumothorax Chest x-ray to my eye today shows no persistent pneumothorax.  Pigtail remains in place.  Vascularity is generous and I have  increased her diuretic today.  I have added a long-acting beta agonist.  She continues Zosyn for possible aspiration as reflected in some density at the right base as well as an elevated white count and procalcitonin.  We have no positive culture results  Elevated Troponin Likely has substantial underlying CAD and I am going to continue her on aspirin and Plavix.   Echo with EF 25-30%, Pattern is suggestive of stress (Takotsubo) cardiomyopathy versus severe multivessel coronary disease. Moderate diastolic dysfunction.  A brief episode of asystole early this morning, I will be adding Xanax in order to wean her off of Precedex avoid any further episodes of extreme bradycardia.  Should bradycardia persist or recur I will be discontinuing her amiodarone she is in place for SVTs earlier in this admission  Plan: Appreciate cards input Continue ASA, Statin, Heparin Per cards note not candidate for cath Trend BNP Trend troponin until clear Lasix 40 x 1  Check lactate to ensure resolution Will add low dose coreg  Renal No acute issues Plan Trend BMET Replete electrolytes as needed  ID New low grade fever T Max 101 WBC bump  From 8.6 to 16.5 last 24 CXR without infiltrate Plan: Sputum Culture Urine Culture Trend CBC/ Fever curve Trend blood cultures / micro Culture as is clinically indicated Monitor off ABX for now Check PCT  GI/ Nutrition Pt is cachectic. FTT Body mass index is 17.46 kg/m. Plan: Will need swallow evaluation and regular diet post extubation Meal Supplements If unable to extubate, start Vital AF 1.2 at 15 ml/hr and increase by 10 ml every 12 hours to goal rate of 45 ml/hr Appreciate dietitian assist SUP  Goals of Care Per cards note 10/7, patient is not good candidate for cath. Need goals of care discussion after extubation as patient is alert and appropriate at present and should be able to make her own decision. This will allow for alignment of goals of care  with plan of care   SUMMARY OF TODAY'S PLAN:    Best Practice / Goals of Care / Disposition.   DVT PROPHYLAXIS: Systemic dose heparin [id number] NUTRITION: Anticipating extubation n.p.o. nutrition MOBILITY:BR/ OOB to chair GOALS OF CARE: To be discussed with family when they arrive later today LABS  Glucose Recent Labs  Lab 05/26/18 1112 05/26/18 1537 05/26/18 1927 05/26/18 2326 05/27/18 0318 05/27/18 0749  GLUCAP 118* 114* 107* 126* 132* 75    BMET Recent Labs  Lab 05/25/18 1855 05/26/18 0446 05/27/18 0327  NA 141 138 141  K 3.7 3.7 3.3*  CL 108 106 104  CO2 27 25 30   BUN 23 24* 22  CREATININE 0.41* 0.35* 0.37*  GLUCOSE 104* 156* 136*    Liver Enzymes Recent Labs  Lab 05/23/18 0107 05/25/18 0500  AST 21 30  ALT 15 25  ALKPHOS 55 48  BILITOT 0.8 1.0  ALBUMIN 3.5 2.5*  Electrolytes Recent Labs  Lab 05/25/18 0500 05/25/18 1855 05/26/18 0446 05/26/18 1633 05/27/18 0327  CALCIUM 8.2* 8.4* 8.4*  --  8.4*  MG 2.5* 2.2  --   --  2.0  PHOS  --  2.1* 2.1* 1.7*  --     CBC Recent Labs  Lab 05/25/18 0500 05/26/18 0446 05/27/18 0327  WBC 25.7* 23.0* 17.8*  HGB 10.6* 9.6* 9.2*  HCT 34.1* 30.7* 29.6*  PLT 256 216 240    ABG Recent Labs  Lab 05/24/18 1308 05/26/18 0600 05/27/18 0508  PHART 7.357 7.403 7.421  PCO2ART 50.4* 43.6 46.3  PO2ART 58.0* 57.9* 104    Coag's Recent Labs  Lab 05/23/18 0107  APTT 20*  INR 0.92    Sepsis Markers Recent Labs  Lab 05/23/18 0107  05/23/18 0919 05/24/18 1059 05/25/18 0500  LATICACIDVEN  --    < > 4.1* 1.3 1.6  PROCALCITON <0.10  --   --  1.41 3.30   < > = values in this interval not displayed.    Cardiac Enzymes Recent Labs  Lab 05/23/18 1908 05/24/18 1248 05/25/18 0500  TROPONINI 4.32* 3.99* 1.84*    PAST MEDICAL HISTORY :   She  has a past medical history of CHF (congestive heart failure) ([facility]) and COPD (chronic obstructive pulmonary disease) ([place]).  PAST SURGICAL HISTORY:   She  has no past surgical history on file.  Allergies  Allergen Reactions  . Erythromycin Anaphylaxis  . Amoxicillin Other (See Comments)    Not sure  . Amoxicillin-Pot Clavulanate Other (See Comments)    Not sure  . Prednisone     Elevates bp  . Spiriva Handihaler [Tiotropium Bromide Monohydrate]     No current facility-administered medications on file prior to encounter.    Current Outpatient Medications on File Prior to Encounter  Medication Sig  . acetaminophen (TYLENOL) 500 MG tablet Take 500 mg by mouth every 6 (six) hours as needed for mild pain.  . hydrochlorothiazide (HYDRODIURIL) 25 MG tablet Take 25 mg by mouth daily.  Marland Kitchen UNABLE TO FIND Med Name: DME- [facility]  Oxygen 2lpm 24/7    FAMILY HISTORY:   Her family history includes Diabetes in her sister; Hypertension in her father and sister.  SOCIAL HISTORY:  She  reports that she quit smoking about 6 years ago. Her smoking use included cigarettes. She has a 49.00 pack-year smoking history. She has never used smokeless tobacco. She reports that she does not drink alcohol or use drugs.     Lars Masson, MD 9:19 AM     Greater than 32 minutes was spent in the care of this acutely ill unstable patient today

## 2018-05-28 ENCOUNTER — Inpatient Hospital Stay (HOSPITAL_COMMUNITY): Payer: Medicare Other

## 2018-05-28 DIAGNOSIS — E43 Unspecified severe protein-calorie malnutrition: Secondary | ICD-10-CM

## 2018-05-28 LAB — COMPREHENSIVE METABOLIC PANEL
ALT: 22 U/L (ref 0–44)
AST: 16 U/L (ref 15–41)
Albumin: 2.1 g/dL — ABNORMAL LOW (ref 3.5–5.0)
Alkaline Phosphatase: 80 U/L (ref 38–126)
Anion gap: 6 (ref 5–15)
BILIRUBIN TOTAL: 0.7 mg/dL (ref 0.3–1.2)
BUN: 20 mg/dL (ref 8–23)
CO2: 31 mmol/L (ref 22–32)
CREATININE: 0.41 mg/dL — AB (ref 0.44–1.00)
Calcium: 8.1 mg/dL — ABNORMAL LOW (ref 8.9–10.3)
Chloride: 102 mmol/L (ref 98–111)
Glucose, Bld: 181 mg/dL — ABNORMAL HIGH (ref 70–99)
Potassium: 3.9 mmol/L (ref 3.5–5.1)
Sodium: 139 mmol/L (ref 135–145)
TOTAL PROTEIN: 5.2 g/dL — AB (ref 6.5–8.1)

## 2018-05-28 LAB — GLUCOSE, CAPILLARY
GLUCOSE-CAPILLARY: 131 mg/dL — AB (ref 70–99)
GLUCOSE-CAPILLARY: 95 mg/dL (ref 70–99)
GLUCOSE-CAPILLARY: 128 mg/dL — AB (ref 70–99)
Glucose-Capillary: 140 mg/dL — ABNORMAL HIGH (ref 70–99)
Glucose-Capillary: 117 mg/dL — ABNORMAL HIGH (ref 70–99)

## 2018-05-28 LAB — POCT I-STAT 3, ART BLOOD GAS (G3+)
Acid-Base Excess: 4 mmol/L — ABNORMAL HIGH (ref 0.0–2.0)
Bicarbonate: 30.3 mmol/L — ABNORMAL HIGH (ref 20.0–28.0)
O2 SAT: 91 %
PCO2 ART: 51.8 mmHg — AB (ref 32.0–48.0)
PO2 ART: 63 mmHg — AB (ref 83.0–108.0)
Patient temperature: 97.9
TCO2: 32 mmol/L (ref 22–32)
pH, Arterial: 7.374 (ref 7.350–7.450)

## 2018-05-28 LAB — CULTURE, BLOOD (ROUTINE X 2)
CULTURE: NO GROWTH
Culture: NO GROWTH
SPECIAL REQUESTS: ADEQUATE

## 2018-05-28 LAB — MAGNESIUM: Magnesium: 1.9 mg/dL (ref 1.7–2.4)

## 2018-05-28 LAB — CBC WITH DIFFERENTIAL/PLATELET
Abs Immature Granulocytes: 0.12 10*3/uL — ABNORMAL HIGH (ref 0.00–0.07)
Basophils Absolute: 0 10*3/uL (ref 0.0–0.1)
Basophils Relative: 0 %
EOS ABS: 0 10*3/uL (ref 0.0–0.5)
Eosinophils Relative: 0 %
HEMATOCRIT: 28.3 % — AB (ref 36.0–46.0)
Hemoglobin: 8.7 g/dL — ABNORMAL LOW (ref 12.0–15.0)
IMMATURE GRANULOCYTES: 1 %
LYMPHS ABS: 0.7 10*3/uL (ref 0.7–4.0)
Lymphocytes Relative: 4 %
MCH: 28.8 pg (ref 26.0–34.0)
MCHC: 30.7 g/dL (ref 30.0–36.0)
MCV: 93.7 fL (ref 80.0–100.0)
MONO ABS: 1.3 10*3/uL — AB (ref 0.1–1.0)
MONOS PCT: 8 %
NEUTROS PCT: 87 %
Neutro Abs: 14.4 10*3/uL — ABNORMAL HIGH (ref 1.7–7.7)
Platelets: 268 10*3/uL (ref 150–400)
RBC: 3.02 MIL/uL — ABNORMAL LOW (ref 3.87–5.11)
RDW: 14.6 % (ref 11.5–15.5)
WBC: 16.4 10*3/uL — ABNORMAL HIGH (ref 4.0–10.5)
nRBC: 0 % (ref 0.0–0.2)

## 2018-05-28 LAB — PROCALCITONIN: Procalcitonin: 0.69 ng/mL

## 2018-05-28 LAB — PHOSPHORUS: PHOSPHORUS: 2.6 mg/dL (ref 2.5–4.6)

## 2018-05-28 MED ORDER — PIPERACILLIN-TAZOBACTAM 3.375 G IVPB
3.3750 g | Freq: Three times a day (TID) | INTRAVENOUS | Status: DC
  Administered 2018-05-28 – 2018-05-30 (×6): 3.375 g via INTRAVENOUS
  Filled 2018-05-28 (×6): qty 50

## 2018-05-28 MED ORDER — ALBUMIN HUMAN 25 % IV SOLN
25.0000 g | Freq: Three times a day (TID) | INTRAVENOUS | Status: AC
  Administered 2018-05-28 – 2018-05-30 (×8): 25 g via INTRAVENOUS
  Filled 2018-05-28 (×6): qty 50
  Filled 2018-05-28 (×2): qty 100
  Filled 2018-05-28 (×2): qty 50
  Filled 2018-05-28: qty 100

## 2018-05-28 MED ORDER — FUROSEMIDE 10 MG/ML IJ SOLN
20.0000 mg | Freq: Three times a day (TID) | INTRAMUSCULAR | Status: DC
  Administered 2018-05-28 – 2018-05-30 (×6): 20 mg via INTRAVENOUS
  Filled 2018-05-28 (×6): qty 2

## 2018-05-28 NOTE — Progress Notes (Signed)
PULMONARY / CRITICAL CARE MEDICINE   NAME:  Angel Hinton, MRN:  [id number], DOB:  11-23-1940, LOS: 5 ADMISSION DATE:  05/23/2018, CONSULTATION DATE:  REFERRING MD:  ED, CHIEF COMPLAINT: Dyspnea  BRIEF HISTORY:         This is a 77 year old with severe COPD at baseline who presented with dyspnea and desaturation.  She was found to have EKG changes consistent with an acute inferior myocardial infarction but due to her concurrent morbidities it was elected not to perform cardiac catheterization.  She was intubated and found to have a tension pneumothorax.  A chest tube has been placed.  Initial troponin was only 0.09, . HISTORY OF PRESENT ILLNESS        77 yr old female w/ PMHx of severe COPD (last FEV1 37%, DLCO 27) not compliant with any bronchodilators, On home oxygen last seen in Pulmonary clinic 07/2017. Presents with SOB, Sats 38%-> increased to 72% on 4L [place] with no complaint of chest pain. Found on EKG to have ST elevations in the inferior and precordial leads.   Troponin on presentation rose to in excess of 5.  Echo shows poor left ventricular function with an LV ejection fraction of 20 to 25%.  She was found to have a tension pneumothorax following  intubation and a right-sided pigtail was placed.  She subsequently developed fevers and leukocytosis with an elevated procalcitonin.  She has been started empirically on Zosyn.  On 10/8 she had an episode of SVT associated with hypotension and she was started on amiodarone infusion.  Past Medical History   Past Medical History:  Diagnosis Date  . CHF (congestive heart failure) ([place])   . COPD (chronic obstructive pulmonary disease) ([facility])     SIGNIFICANT PAST MEDICAL HISTORY   Severe COPD, CHF, coronary hypertension  SIGNIFICANT EVENTS:  Intubated 10/7 STUDIES:   Echo 10/7>> Normal LV size with EF 25-30%. Wall motion abnormalities as noted   above. Pattern is suggestive of stress (Takotsubo) cardiomyopathy   versus severe multivessel  coronary disease. Moderate diastolic   dysfunction. Normal RV size and systolic function. Dilated IVC   suggestive of elevated RV filling pressure. Pulmonary arteries: PA peak pressure: 39 mm Hg (S). CULTURES:  10/7: Respiratory culture pending 10/7>> MRSA PCR >> Neg 10/7 Blood Culture >> Pending  ANTIBIOTICS:  Zosyn   LINES/TUBES:   Pigtail placed 10/7 CONSULTANTS:  Cardiology 10/7 SUBJECTIVE:  Quiet night.  She is on 20 mcg of levo fed this morning and a fentanyl infusion at 50 mcg/h.  She is alert and nodding to questions.      CONSTITUTIONAL: BP (!) 131/57   Pulse 67   Temp 97.6 F (36.4 C)   Resp (!) 27   Ht 5\' 5"  (1.651 m)   Wt 46.7 kg   SpO2 97%   BMI 17.13 kg/m   I/O last 3 completed shifts: In: 3901.3 [I.V.:866.6; Other:30; NG/GT:1987.5; IV Piggyback:1017.1] Out: 4098 [Urine:1550; Chest Tube:40]     Vent Mode: PRVC FiO2 (%):  [70 %-100 %] 90 % Set Rate:  [20 bmp] 20 bmp Vt Set:  [470 mL] 470 mL PEEP:  [8 cmH20] 8 cmH20 Plateau Pressure:  [20 cmH20-24 cmH20] 20 cmH20  PHYSICAL EXAM: General: Cachectic elderly female who is orally intubated and mechanically ventilated in no distress      Neuro: Nodding appropriately to questions and moving all fours      HEENT: No overt JVD  Cardiovascular: S1 and S2 are regular without murmur rub or gallop.  She has 1+ anasarca      Lungs: Symmetric air movement, few scattered rhonchi and no wheezes.  Respirations are unlabored  There is no air leak from the chest tube.    Abdomen: The abdomen is scaphoid without any organomegaly masses tenderness guarding or rebound   Musculoskeletal: 1+  anasarca  Skin: Frail, warm dry and intact   RESOLVED PROBLEM LIST   ASSESSMENT AND PLAN   Acute on Chronic Respiratory Failure 2/2 tension pneumothorax Unfortunately she has severe COPD at baseline, she suffered from a right tension pneumothorax, and she now has severe left ventricular systolic dysfunction with fluid overload all  contributing to her respiratory failure.  She is not bronchospastic at present and I do not feel compelled to add systemic steroids.  She does have a persistent white count which is coming down and I am going to continue her on her empiric Zosyn pending results of repeat chest x-ray in the morning.  In order to facilitate fluid removal I have added albumin to her Lasix.  Elevated Troponin Likely has substantial underlying CAD and I am going to continue her on aspirin and statin.  .   Echo with EF 25-30%, Pattern is suggestive of stress (Takotsubo) cardiomyopathy versus severe multivessel coronary disease. Moderate diastolic dysfunction.  I am attempting to improve diuresis with the addition of albumin.  Will contemplate the addition of a beta-blocker and afterload reduction when she is no longer requiring levo fed.  Plan: Appreciate cards input Continue ASA, Statin, Per cards note not candidate for cath Trend BNP Trend troponin until clear Lasix 40 x 1  Check lactate to ensure resolution Will add low dose coreg  Renal No acute issues Plan Trend BMET Replete electrolytes as needed  ID New low grade fever T Max 101 WBC bump  From 8.6 to 16.5 last 24 CXR without infiltrate Plan: Been no sputum culture.   Urine Culture Trend CBC/ Fever curve Trend blood cultures / micro Culture as is clinically indicated Monitor off ABX for now Check PCT  GI/ Nutrition Pt is cachectic. FTT Body mass index is 17.13 kg/m. Plan: Will need swallow evaluation and regular diet post extubation Meal Supplements If unable to extubate, start Vital AF 1.2 at 15 ml/hr and increase by 10 ml every 12 hours to goal rate of 45 ml/hr Appreciate dietitian assist SUP  Goals of Care Per cards note 10/7, patient is not good candidate for cath. Need goals of care discussion after extubation as patient is alert and appropriate at present and should be able to make her own decision. This will allow for  alignment of goals of care with plan of care   SUMMARY OF TODAY'S PLAN:    Best Practice / Goals of Care / Disposition.   DVT PROPHYLAXIS: Systemic dose heparin [id number] NUTRITION: Anticipating extubation n.p.o. nutrition MOBILITY:BR/ OOB to chair GOALS OF CARE: To be discussed with family when they arrive later today LABS  Glucose Recent Labs  Lab 05/27/18 1117 05/27/18 1542 05/27/18 1933 05/27/18 2323 05/28/18 0336 05/28/18 0721  GLUCAP 132* 106* 90 106* 140* 131*    BMET Recent Labs  Lab 05/26/18 0446 05/27/18 0327 05/28/18 0406  NA 138 141 139  K 3.7 3.3* 3.9  CL 106 104 102  CO2 25 30 31   BUN 24* 22 20  CREATININE 0.35* 0.37* 0.41*  GLUCOSE 156* 136* 181*    Liver Enzymes Recent Labs  Lab 05/23/18 0107 05/25/18 0500 05/28/18 0406  AST 21 30 16  ALT 15 25 22   ALKPHOS 55 48 80  BILITOT 0.8 1.0 0.7  ALBUMIN 3.5 2.5* 2.1*    Electrolytes Recent Labs  Lab 05/25/18 1855 05/26/18 0446 05/26/18 1633 05/27/18 0327 05/28/18 0406  CALCIUM 8.4* 8.4*  --  8.4* 8.1*  MG 2.2  --   --  2.0 1.9  PHOS 2.1* 2.1* 1.7*  --  2.6    CBC Recent Labs  Lab 05/26/18 0446 05/27/18 0327 05/28/18 0406  WBC 23.0* 17.8* 16.4*  HGB 9.6* 9.2* 8.7*  HCT 30.7* 29.6* 28.3*  PLT 216 240 268    ABG Recent Labs  Lab 05/26/18 0600 05/27/18 0508 05/28/18 0005  PHART 7.403 7.421 7.374  PCO2ART 43.6 46.3 51.8*  PO2ART 57.9* 104 63.0*    Coag's Recent Labs  Lab 05/23/18 0107  APTT 20*  INR 0.92    Sepsis Markers Recent Labs  Lab 05/23/18 0107  05/23/18 0919 05/24/18 1059 05/25/18 0500  LATICACIDVEN  --    < > 4.1* 1.3 1.6  PROCALCITON <0.10  --   --  1.41 3.30   < > = values in this interval not displayed.    Cardiac Enzymes Recent Labs  Lab 05/23/18 1908 05/24/18 1248 05/25/18 0500  TROPONINI 4.32* 3.99* 1.84*    PAST MEDICAL HISTORY :   She  has a past medical history of CHF (congestive heart failure) ([facility]) and COPD (chronic  obstructive pulmonary disease) ([place]).  PAST SURGICAL HISTORY:  She  has no past surgical history on file.  Allergies  Allergen Reactions  . Erythromycin Anaphylaxis  . Amoxicillin Other (See Comments)    Not sure  . Amoxicillin-Pot Clavulanate Other (See Comments)    Not sure  . Prednisone     Elevates bp  . Spiriva Handihaler [Tiotropium Bromide Monohydrate]     No current facility-administered medications on file prior to encounter.    Current Outpatient Medications on File Prior to Encounter  Medication Sig  . acetaminophen (TYLENOL) 500 MG tablet Take 500 mg by mouth every 6 (six) hours as needed for mild pain.  . hydrochlorothiazide (HYDRODIURIL) 25 MG tablet Take 25 mg by mouth daily.  Marland Kitchen UNABLE TO FIND Med Name: DME- [facility]  Oxygen 2lpm 24/7    FAMILY HISTORY:   Her family history includes Diabetes in her sister; Hypertension in her father and sister.  SOCIAL HISTORY:  She  reports that she quit smoking about 6 years ago. Her smoking use included cigarettes. She has a 49.00 pack-year smoking history. She has never used smokeless tobacco. She reports that she does not drink alcohol or use drugs.     Lars Masson, MD 9:50 AM

## 2018-05-29 ENCOUNTER — Inpatient Hospital Stay (HOSPITAL_COMMUNITY): Payer: Medicare Other

## 2018-05-29 LAB — COMPREHENSIVE METABOLIC PANEL
ALK PHOS: 64 U/L (ref 38–126)
ALT: 19 U/L (ref 0–44)
ANION GAP: 9 (ref 5–15)
AST: 17 U/L (ref 15–41)
Albumin: 3 g/dL — ABNORMAL LOW (ref 3.5–5.0)
BILIRUBIN TOTAL: 0.7 mg/dL (ref 0.3–1.2)
BUN: 16 mg/dL (ref 8–23)
CO2: 35 mmol/L — ABNORMAL HIGH (ref 22–32)
Calcium: 8.7 mg/dL — ABNORMAL LOW (ref 8.9–10.3)
Chloride: 98 mmol/L (ref 98–111)
Creatinine, Ser: 0.35 mg/dL — ABNORMAL LOW (ref 0.44–1.00)
GFR calc non Af Amer: >60 mL/min (ref >60)
Glucose, Bld: 110 mg/dL — ABNORMAL HIGH (ref 70–99)
Potassium: 4.5 mmol/L (ref 3.5–5.1)
SODIUM: 142 mmol/L (ref 135–145)
TOTAL PROTEIN: 5.9 g/dL — AB (ref 6.5–8.1)

## 2018-05-29 LAB — CBC WITH DIFFERENTIAL/PLATELET
Abs Immature Granulocytes: 0.11 10*3/uL — ABNORMAL HIGH (ref 0.00–0.07)
Basophils Absolute: 0 10*3/uL (ref 0.0–0.1)
Basophils Relative: 0 %
EOS PCT: 0 %
Eosinophils Absolute: 0.1 10*3/uL (ref 0.0–0.5)
HEMATOCRIT: 29.8 % — AB (ref 36.0–46.0)
HEMOGLOBIN: 8.8 g/dL — AB (ref 12.0–15.0)
IMMATURE GRANULOCYTES: 1 %
LYMPHS ABS: 1.1 10*3/uL (ref 0.7–4.0)
LYMPHS PCT: 8 %
MCH: 28.5 pg (ref 26.0–34.0)
MCHC: 29.5 g/dL — AB (ref 30.0–36.0)
MCV: 96.4 fL (ref 80.0–100.0)
MONOS PCT: 13 %
Monocytes Absolute: 1.8 10*3/uL — ABNORMAL HIGH (ref 0.1–1.0)
Neutro Abs: 10.4 10*3/uL — ABNORMAL HIGH (ref 1.7–7.7)
Neutrophils Relative %: 78 %
Platelets: 225 10*3/uL (ref 150–400)
RBC: 3.09 MIL/uL — ABNORMAL LOW (ref 3.87–5.11)
RDW: 16 % — ABNORMAL HIGH (ref 11.5–15.5)
WBC: 13.5 10*3/uL — ABNORMAL HIGH (ref 4.0–10.5)
nRBC: 0.1 % (ref 0.0–0.2)

## 2018-05-29 LAB — POCT I-STAT 3, ART BLOOD GAS (G3+)
Acid-Base Excess: 11 mmol/L — ABNORMAL HIGH (ref 0.0–2.0)
Bicarbonate: 35.4 mmol/L — ABNORMAL HIGH (ref 20.0–28.0)
O2 Saturation: 93 %
PCO2 ART: 47.2 mmHg (ref 32.0–48.0)
Patient temperature: 98.7
TCO2: 37 mmol/L — ABNORMAL HIGH (ref 22–32)
pH, Arterial: 7.483 — ABNORMAL HIGH (ref 7.350–7.450)
pO2, Arterial: 63 mmHg — ABNORMAL LOW (ref 83.0–108.0)

## 2018-05-29 LAB — BASIC METABOLIC PANEL
Anion gap: 8 (ref 5–15)
BUN: 16 mg/dL (ref 8–23)
CO2: 36 mmol/L — ABNORMAL HIGH (ref 22–32)
CREATININE: 0.36 mg/dL — AB (ref 0.44–1.00)
Calcium: 8.4 mg/dL — ABNORMAL LOW (ref 8.9–10.3)
Chloride: 97 mmol/L — ABNORMAL LOW (ref 98–111)
Glucose, Bld: 127 mg/dL — ABNORMAL HIGH (ref 70–99)
Potassium: 3.2 mmol/L — ABNORMAL LOW (ref 3.5–5.1)
SODIUM: 141 mmol/L (ref 135–145)

## 2018-05-29 LAB — GLUCOSE, CAPILLARY
GLUCOSE-CAPILLARY: 135 mg/dL — AB (ref 70–99)
Glucose-Capillary: 103 mg/dL — ABNORMAL HIGH (ref 70–99)
Glucose-Capillary: 112 mg/dL — ABNORMAL HIGH (ref 70–99)
Glucose-Capillary: 114 mg/dL — ABNORMAL HIGH (ref 70–99)
Glucose-Capillary: 135 mg/dL — ABNORMAL HIGH (ref 70–99)
Glucose-Capillary: 137 mg/dL — ABNORMAL HIGH (ref 70–99)

## 2018-05-29 LAB — PROCALCITONIN: PROCALCITONIN: 0.45 ng/mL

## 2018-05-29 MED ORDER — CHLORHEXIDINE GLUCONATE CLOTH 2 % EX PADS
6.0000 | MEDICATED_PAD | Freq: Every day | CUTANEOUS | Status: DC
  Administered 2018-05-29 – 2018-06-01 (×4): 6 via TOPICAL

## 2018-05-29 MED ORDER — POTASSIUM CHLORIDE 10 MEQ/50ML IV SOLN
10.0000 meq | INTRAVENOUS | Status: AC
  Administered 2018-05-29 (×4): 10 meq via INTRAVENOUS
  Filled 2018-05-29 (×4): qty 50

## 2018-05-29 NOTE — Plan of Care (Signed)
  Problem: Nutrition: Goal: Adequate nutrition will be maintained Outcome: Progressing   Problem: Elimination: Goal: Will not experience complications related to urinary retention Outcome: Progressing   Problem: Safety: Goal: Ability to remain free from injury will improve Outcome: Progressing   Problem: Activity: Goal: Ability to tolerate increased activity will improve Outcome: Progressing   Problem: Respiratory: Goal: Ability to maintain a clear airway and adequate ventilation will improve Outcome: Progressing

## 2018-05-29 NOTE — Progress Notes (Signed)
PULMONARY / CRITICAL CARE MEDICINE   NAME:  Angel Hinton, MRN:  [id number], DOB:  15-Jun-1941, LOS: 6 ADMISSION DATE:  05/23/2018, CONSULTATION DATE:  REFERRING MD:  ED, CHIEF COMPLAINT: Dyspnea  BRIEF HISTORY:         This is a 77 year old with severe COPD at baseline who presented with dyspnea and desaturation.  She was found to have EKG changes consistent with an acute inferior myocardial infarction but due to her concurrent morbidities it was elected not to perform cardiac catheterization.  She was intubated and found to have a tension pneumothorax.  A chest tube has been placed.  Initial troponin was only 0.09, . HISTORY OF PRESENT ILLNESS        77 yr old female w/ PMHx of severe COPD (last FEV1 37%, DLCO 27) not compliant with any bronchodilators, On home oxygen last seen in Pulmonary clinic 07/2017. Presents with SOB, Sats 38%-> increased to 72% on 4L [place] with no complaint of chest pain. Found on EKG to have ST elevations in the inferior and precordial leads.   Troponin on presentation rose to in excess of 5.  Echo shows poor left ventricular function with an LV ejection fraction of 20 to 25%.  She was found to have a tension pneumothorax following  intubation and a right-sided pigtail was placed.  She subsequently developed fevers and leukocytosis with an elevated procalcitonin.  She has been started empirically on Zosyn.  On 10/8 she had an episode of SVT associated with hypotension and she was started on amiodarone infusion.  Past Medical History   Past Medical History:  Diagnosis Date  . CHF (congestive heart failure) ([place])   . COPD (chronic obstructive pulmonary disease) ([facility])     SIGNIFICANT PAST MEDICAL HISTORY   Severe COPD, CHF, coronary hypertension  SIGNIFICANT EVENTS:  Intubated 10/7 STUDIES:   Echo 10/7>> Normal LV size with EF 25-30%. Wall motion abnormalities as noted   above. Pattern is suggestive of stress (Takotsubo) cardiomyopathy   versus severe multivessel  coronary disease. Moderate diastolic   dysfunction. Normal RV size and systolic function. Dilated IVC   suggestive of elevated RV filling pressure. Pulmonary arteries: PA peak pressure: 39 mm Hg (S). CULTURES:  10/7: Respiratory culture pending 10/7>> MRSA PCR >> Neg 10/7 Blood Culture >> Pending  ANTIBIOTICS:  Zosyn   LINES/TUBES:   Pigtail placed 10/7 CONSULTANTS:  Cardiology 10/7 SUBJECTIVE:  Another quiet night.  Urine output has improved with the addition of albumin to Lasix.  She is alert and nodding appropriately to questions.        CONSTITUTIONAL: BP (!) 100/43   Pulse 65   Temp 98.9 F (37.2 C) (Oral)   Resp 20   Ht 5\' 5"  (1.651 m)   Wt 46.7 kg   SpO2 96%   BMI 17.13 kg/m   I/O last 3 completed shifts: In: 3115.4 [I.V.:841.3; NG/GT:2040; IV Piggyback:234.1] Out: 2040 [Urine:2000; Chest Tube:40]     Vent Mode: PRVC FiO2 (%):  [60 %-70 %] 60 % Set Rate:  [20 bmp] 20 bmp Vt Set:  [450 mL-470 mL] 450 mL PEEP:  [8 cmH20] 8 cmH20 Plateau Pressure:  [17 cmH20-19 cmH20] 17 cmH20  PHYSICAL EXAM: General: Cachectic elderly female who is orally intubated and mechanically ventilated and in no distress.       Neuro: Nodding appropriately to questions and moving all fours       HEENT: No overt JVD  Cardiovascular: S1 and S2 are regular without murmur rub or  gallop.  She has 1-2+ anasarca     Lungs: Respirations are unlabored, she is not breathing above the set ventilator rate, minute ventilation is approximately 9 L/min.  There is symmetric air movement and no wheezes.  There is no air leak from the right-sided chest tube.    Abdomen: The abdomen is soft without any organomegaly masses or tenderness    Musculoskeletal: 1+  anasarca  Skin: Frail, warm dry and intact   RESOLVED PROBLEM LIST   ASSESSMENT AND PLAN   Acute on Chronic Respiratory Failure 2/2 tension pneumothorax Unfortunately she has severe COPD at baseline, she suffered from a right tension  pneumothorax, and she now has severe left ventricular systolic dysfunction with fluid overload all contributing to her respiratory failure.  She may very well have a right lower lobe infiltrate as well for which she has been placed on Zosyn.  We do not have any respiratory cultures.  I am continuing her diuresis, will add Zaroxolyn if we do not have an net out greater than in today, continue Zosyn to which she is she is responding as judged by her procalcitonin and white count.  Continue current bronchodilators.  I do not anticipate removal of the pigtail catheter so long she is on positive pressure ventilation.  Elevated Troponin Likely has substantial underlying CAD and I am going to continue her on aspirin and statin.  .   Echo with EF 25-30%, Pattern is suggestive of stress (Takotsubo) cardiomyopathy versus severe multivessel coronary disease. Moderate diastolic dysfunction.  I am attempting to improve diuresis with the addition of albumin.  Will contemplate the addition of a beta-blocker and afterload reduction when she is no longer requiring levo fed.  Plan: Appreciate cards input Continue ASA, Statin, Per cards note not candidate for cath Trend BNP Trend troponin until clear Lasix 40 x 1  Check lactate to ensure resolution Will add low dose coreg  Renal No acute issues Plan Trend BMET Replete electrolytes as needed  ID New low grade fever T Max 101 WBC bump  From 8.6 to 16.5 last 24 CXR without infiltrate Plan: Been no sputum culture.   Urine Culture Trend CBC/ Fever curve Trend blood cultures / micro Culture as is clinically indicated Monitor off ABX for now Check PCT  GI/ Nutrition Pt is cachectic. FTT Body mass index is 17.13 kg/m. Plan: Will need swallow evaluation and regular diet post extubation Meal Supplements If unable to extubate, start Vital AF 1.2 at 15 ml/hr and increase by 10 ml every 12 hours to goal rate of 45 ml/hr Appreciate dietitian  assist SUP  Goals of Care Per cards note 10/7, patient is not good candidate for cath. Need goals of care discussion after extubation as patient is alert and appropriate at present and should be able to make her own decision. This will allow for alignment of goals of care with plan of care   SUMMARY OF TODAY'S PLAN:    Best Practice / Goals of Care / Disposition.   DVT PROPHYLAXIS: Systemic dose heparin IOE:[id number] NUTRITION: Anticipating extubation n.p.o. nutrition MOBILITY:BR/ OOB to chair GOALS OF CARE: To be discussed with family when they arrive later today LABS  Glucose Recent Labs  Lab 05/28/18 1203 05/28/18 1550 05/28/18 2021 05/28/18 2348 05/29/18 0328 05/29/18 0755  GLUCAP 95 128* 117* 135* 114* 135*    BMET Recent Labs  Lab 05/27/18 0327 05/28/18 0406 05/29/18 0647  NA 141 139 141  K 3.3* 3.9 3.2*  CL 104 102  97*  CO2 30 31 36*  BUN 22 20 16   CREATININE 0.37* 0.41* 0.36*  GLUCOSE 136* 181* 127*    Liver Enzymes Recent Labs  Lab 05/23/18 0107 05/25/18 0500 05/28/18 0406  AST 21 30 16   ALT 15 25 22   ALKPHOS 55 48 80  BILITOT 0.8 1.0 0.7  ALBUMIN 3.5 2.5* 2.1*    Electrolytes Recent Labs  Lab 05/25/18 1855 05/26/18 0446 05/26/18 1633 05/27/18 0327 05/28/18 0406 05/29/18 0647  CALCIUM 8.4* 8.4*  --  8.4* 8.1* 8.4*  MG 2.2  --   --  2.0 1.9  --   PHOS 2.1* 2.1* 1.7*  --  2.6  --     CBC Recent Labs  Lab 05/27/18 0327 05/28/18 0406 05/29/18 0353  WBC 17.8* 16.4* 13.5*  HGB 9.2* 8.7* 8.8*  HCT 29.6* 28.3* 29.8*  PLT 240 268 225    ABG Recent Labs  Lab 05/27/18 0508 05/28/18 0005 05/29/18 0349  PHART 7.421 7.374 7.483*  PCO2ART 46.3 51.8* 47.2  PO2ART 104 63.0* 63.0*    Coag's Recent Labs  Lab 05/23/18 0107  APTT 20*  INR 0.92    Sepsis Markers Recent Labs  Lab 05/23/18 0919 05/24/18 1059 05/25/18 0500 05/28/18 0406 05/29/18 0647  LATICACIDVEN 4.1* 1.3 1.6  --   --   PROCALCITON  --  1.41 3.30 0.69  0.45    Cardiac Enzymes Recent Labs  Lab 05/23/18 1908 05/24/18 1248 05/25/18 0500  TROPONINI 4.32* 3.99* 1.84*    PAST MEDICAL HISTORY :   She  has a past medical history of CHF (congestive heart failure) ([facility]) and COPD (chronic obstructive pulmonary disease) ([place]).  PAST SURGICAL HISTORY:  She  has no past surgical history on file.  Allergies  Allergen Reactions  . Erythromycin Anaphylaxis  . Amoxicillin Other (See Comments)    Not sure  . Amoxicillin-Pot Clavulanate Other (See Comments)    Not sure  . Prednisone     Elevates bp  . Spiriva Handihaler [Tiotropium Bromide Monohydrate]     No current facility-administered medications on file prior to encounter.    Current Outpatient Medications on File Prior to Encounter  Medication Sig  . acetaminophen (TYLENOL) 500 MG tablet Take 500 mg by mouth every 6 (six) hours as needed for mild pain.  . hydrochlorothiazide (HYDRODIURIL) 25 MG tablet Take 25 mg by mouth daily.  Marland Kitchen UNABLE TO FIND Med Name: DME- [facility]  Oxygen 2lpm 24/7    FAMILY HISTORY:   Her family history includes Diabetes in her sister; Hypertension in her father and sister.  SOCIAL HISTORY:  She  reports that she quit smoking about 6 years ago. Her smoking use included cigarettes. She has a 49.00 pack-year smoking history. She has never used smokeless tobacco. She reports that she does not drink alcohol or use drugs.     Lars Masson, MD 11:35 AM

## 2018-05-30 DIAGNOSIS — R579 Shock, unspecified: Secondary | ICD-10-CM

## 2018-05-30 DIAGNOSIS — E8770 Fluid overload, unspecified: Secondary | ICD-10-CM

## 2018-05-30 LAB — BASIC METABOLIC PANEL
ANION GAP: 7 (ref 5–15)
BUN: 16 mg/dL (ref 8–23)
CHLORIDE: 97 mmol/L — AB (ref 98–111)
CO2: 39 mmol/L — ABNORMAL HIGH (ref 22–32)
Calcium: 8.8 mg/dL — ABNORMAL LOW (ref 8.9–10.3)
Creatinine, Ser: 0.39 mg/dL — ABNORMAL LOW (ref 0.44–1.00)
GFR calc Af Amer: >60 mL/min (ref >60)
Glucose, Bld: 124 mg/dL — ABNORMAL HIGH (ref 70–99)
POTASSIUM: 3.5 mmol/L (ref 3.5–5.1)
SODIUM: 143 mmol/L (ref 135–145)

## 2018-05-30 LAB — CBC WITH DIFFERENTIAL/PLATELET
ABS IMMATURE GRANULOCYTES: 0.1 10*3/uL — AB (ref 0.00–0.07)
BASOS ABS: 0 10*3/uL (ref 0.0–0.1)
Basophils Relative: 0 %
Eosinophils Absolute: 0.1 10*3/uL (ref 0.0–0.5)
Eosinophils Relative: 1 %
HEMATOCRIT: 26.3 % — AB (ref 36.0–46.0)
Hemoglobin: 7.8 g/dL — ABNORMAL LOW (ref 12.0–15.0)
Immature Granulocytes: 1 %
LYMPHS ABS: 0.7 10*3/uL (ref 0.7–4.0)
LYMPHS PCT: 5 %
MCH: 28.7 pg (ref 26.0–34.0)
MCHC: 29.7 g/dL — ABNORMAL LOW (ref 30.0–36.0)
MCV: 96.7 fL (ref 80.0–100.0)
MONO ABS: 1.4 10*3/uL — AB (ref 0.1–1.0)
Monocytes Relative: 10 %
NEUTROS ABS: 10.8 10*3/uL — AB (ref 1.7–7.7)
NRBC: 0 % (ref 0.0–0.2)
Neutrophils Relative %: 83 %
Platelets: 327 10*3/uL (ref 150–400)
RBC: 2.72 MIL/uL — AB (ref 3.87–5.11)
RDW: 14.8 % (ref 11.5–15.5)
WBC: 13 10*3/uL — AB (ref 4.0–10.5)

## 2018-05-30 LAB — GLUCOSE, CAPILLARY
GLUCOSE-CAPILLARY: 118 mg/dL — AB (ref 70–99)
GLUCOSE-CAPILLARY: 116 mg/dL — AB (ref 70–99)
GLUCOSE-CAPILLARY: 118 mg/dL — AB (ref 70–99)
GLUCOSE-CAPILLARY: 89 mg/dL (ref 70–99)
Glucose-Capillary: 139 mg/dL — ABNORMAL HIGH (ref 70–99)
Glucose-Capillary: 96 mg/dL (ref 70–99)
Glucose-Capillary: 95 mg/dL (ref 70–99)
Glucose-Capillary: 122 mg/dL — ABNORMAL HIGH (ref 70–99)

## 2018-05-30 LAB — MAGNESIUM: MAGNESIUM: 1.9 mg/dL (ref 1.7–2.4)

## 2018-05-30 LAB — PROCALCITONIN: Procalcitonin: 0.22 ng/mL

## 2018-05-30 LAB — PHOSPHORUS: Phosphorus: 2.8 mg/dL (ref 2.5–4.6)

## 2018-05-30 MED ORDER — FUROSEMIDE 10 MG/ML IJ SOLN
40.0000 mg | Freq: Three times a day (TID) | INTRAMUSCULAR | Status: AC
  Administered 2018-05-30 (×2): 40 mg via INTRAVENOUS
  Filled 2018-05-30 (×2): qty 4

## 2018-05-30 NOTE — Progress Notes (Signed)
PULMONARY / CRITICAL CARE MEDICINE   NAME:  Angel Hinton, MRN:  [id number], DOB:  1941/05/11, LOS: 7 ADMISSION DATE:  05/23/2018, CONSULTATION DATE:  REFERRING MD:  ED, CHIEF COMPLAINT: Dyspnea  BRIEF HISTORY:         This is a 77 year old with severe COPD at baseline who presented with dyspnea and desaturation.  She was found to have EKG changes consistent with an acute inferior myocardial infarction but due to her concurrent morbidities it was elected not to perform cardiac catheterization.  She was intubated and found to have a tension pneumothorax.  A chest tube has been placed.  Initial troponin was only 0.09, .  HISTORY OF PRESENT ILLNESS        77 yr old female w/ PMHx of severe COPD (last FEV1 37%, DLCO 27) not compliant with any bronchodilators, On home oxygen last seen in Pulmonary clinic 07/2017. Presents with SOB, Sats 38%-> increased to 72% on 4L [place] with no complaint of chest pain. Found on EKG to have ST elevations in the inferior and precordial leads.   Troponin on presentation rose to in excess of 5.  Echo shows poor left ventricular function with an LV ejection fraction of 20 to 25%.  She was found to have a tension pneumothorax following  intubation and a right-sided pigtail was placed.  She subsequently developed fevers and leukocytosis with an elevated procalcitonin.  She has been started empirically on Zosyn.  On 10/8 she had an episode of SVT associated with hypotension and she was started on amiodarone infusion.  Past Medical History   Past Medical History:  Diagnosis Date  . CHF (congestive heart failure) ([place])   . COPD (chronic obstructive pulmonary disease) ([facility])     SIGNIFICANT PAST MEDICAL HISTORY   Severe COPD, CHF, coronary hypertension  SIGNIFICANT EVENTS:  Intubated 10/7 STUDIES:   Echo 10/7>> Normal LV size with EF 25-30%. Wall motion abnormalities as noted   above. Pattern is suggestive of stress (Takotsubo) cardiomyopathy   versus severe multivessel  coronary disease. Moderate diastolic   dysfunction. Normal RV size and systolic function. Dilated IVC   suggestive of elevated RV filling pressure. Pulmonary arteries: PA peak pressure: 39 mm Hg (S). CULTURES:  10/7: Respiratory culture pending 10/7>> MRSA PCR >> Neg 10/7 Blood Culture >> Pending  ANTIBIOTICS:  Zosyn   LINES/TUBES:   Pigtail placed 10/7>>> ETT 10/7>>> L IJ TLC 10/8>>>  CONSULTANTS:  Cardiology 10/7  SUBJECTIVE:   No events overnight, remains on pressors and precedex.  Not weaning due to very high FiO2 and PEEP need    CONSTITUTIONAL: BP (!) 119/41   Pulse 75   Temp 98.8 F (37.1 C) (Oral)   Resp 17   Ht 5\' 5"  (1.651 m)   Wt 46.7 kg   SpO2 100%   BMI 17.13 kg/m   I/O last 3 completed shifts: In: 4443.5 [I.V.:640.2; NG/GT:1695; IV Piggyback:2108.3] Out: 2345 [Urine:2275; Chest Tube:70]     Vent Mode: PRVC FiO2 (%):  [40 %-100 %] 80 % Set Rate:  [16 bmp-20 bmp] 16 bmp Vt Set:  [450 mL] 450 mL PEEP:  [8 cmH20] 8 cmH20 Plateau Pressure:  [16 XVQ00-86 cmH20] 18 cmH20  PHYSICAL EXAM: General: Cachectic elderly female, arousable but sedate on vent Neuro: Arousable, following commands, moving ext to command HEENT: [place]/AT, PERRL, EOM-I and MMM Cardiovascular: RRR, Nl S1/S2 and -M/R/G Lungs: Distant breath sounds in all lung fields, no airleak from CT Abdomen: Soft, NT, ND and +BS Musculoskeletal: 1+ edema  Skin: Intact  RESOLVED PROBLEM LIST   ASSESSMENT AND PLAN   Acute on Chronic Respiratory Failure 2/2 tension pneumothorax Unfortunately she has severe COPD at baseline, she suffered from a right tension pneumothorax, and she now has severe left ventricular systolic dysfunction with fluid overload all contributing to her respiratory failure.    - Maintain PEEP at 10 given PTX  - Titrate O2 for sat of 88-92%  - Active diureses today  - Hold off weaning  - Continue bronchodilators as ordered  - Message left for family to discuss plan of care  and GOC  Elevated Troponin Likely has substantial underlying CAD and I am going to continue her on aspirin and statin.  .   Echo with EF 25-30%, Pattern is suggestive of stress (Takotsubo) cardiomyopathy versus severe multivessel coronary disease. Moderate diastolic dysfunction.   - Appreciate cards input  - Continue ASA, Statin,  - Per cards note not candidate for cath  - Increase lasix to 40 mg IV q8 x2 doses  - Levophed for BP support  - Check CVP  - On low dose coreg  - Maintain CT on suction  Renal  - BMET now  - Lasix as above  - Replace electrolytes as indicated  - KVO IVF  ID Fever resolved and infiltrate is difficult to ascertain  - F/U on urine Culture  - Trend CBC/ Fever curve  - Trend blood cultures / micro  - D/C zosyn since PCT is 0.22 and no fever  GI/ Nutrition Pt is cachectic. FTT Body mass index is 17.13 kg/m. Plan:  - TF per nutrition  - If ever extubated will need SLP as there is a concern for aspiration  Goals of Care Per cards note 10/7, patient is not good candidate for cath. Need goals of care discussion after extubation as patient is alert and appropriate at present and should be able to make her own decision. Sister contacted for [place], will recommend comfort if remains with such high vent demand and DNR status.  SUMMARY OF TODAY'S PLAN:  See above  Best Practice / Goals of Care / Disposition.   DVT PROPHYLAXIS: Systemic dose heparin UMP:[id number] NUTRITION: Anticipating extubation n.p.o. nutrition MOBILITY:BR/ OOB to chair GOALS OF CARE: To be discussed with family when they arrive later today  LABS  Glucose Recent Labs  Lab 05/29/18 1600 05/29/18 1946 05/29/18 2322 05/30/18 0348 05/30/18 0712 05/30/18 0824  GLUCAP 137* 112* 118* 139* 96 95    BMET Recent Labs  Lab 05/28/18 0406 05/29/18 0647 05/29/18 1815  NA 139 141 142  K 3.9 3.2* 4.5  CL 102 97* 98  CO2 31 36* 35*  BUN 20 16 16   CREATININE 0.41* 0.36* 0.35*  GLUCOSE  181* 127* 110*    Liver Enzymes Recent Labs  Lab 05/25/18 0500 05/28/18 0406 05/29/18 1815  AST 30 16 17   ALT 25 22 19   ALKPHOS 48 80 64  BILITOT 1.0 0.7 0.7  ALBUMIN 2.5* 2.1* 3.0*    Electrolytes Recent Labs  Lab 05/26/18 1633 05/27/18 0327 05/28/18 0406 05/29/18 0647 05/29/18 1815 05/30/18 0457  CALCIUM  --  8.4* 8.1* 8.4* 8.7*  --   MG  --  2.0 1.9  --   --  1.9  PHOS 1.7*  --  2.6  --   --  2.8    CBC Recent Labs  Lab 05/28/18 0406 05/29/18 0353 05/30/18 0457  WBC 16.4* 13.5* 13.0*  HGB 8.7* 8.8* 7.8*  HCT 28.3* 29.8*  26.3*  PLT 268 225 327    ABG Recent Labs  Lab 05/27/18 0508 05/28/18 0005 05/29/18 0349  PHART 7.421 7.374 7.483*  PCO2ART 46.3 51.8* 47.2  PO2ART 104 63.0* 63.0*    Coag's No results for input(s): APTT, INR in the last 168 hours.  Sepsis Markers Recent Labs  Lab 05/24/18 1059 05/25/18 0500 05/28/18 0406 05/29/18 0647 05/30/18 0457  LATICACIDVEN 1.3 1.6  --   --   --   PROCALCITON 1.41 3.30 0.69 0.45 0.22    Cardiac Enzymes Recent Labs  Lab 05/23/18 1908 05/24/18 1248 05/25/18 0500  TROPONINI 4.32* 3.99* 1.84*   The patient is critically ill with multiple organ systems failure and requires high complexity decision making for assessment and support, frequent evaluation and titration of therapies, application of advanced monitoring technologies and extensive interpretation of multiple databases.   Critical Care Time devoted to patient care services described in this note is  34  Minutes. This time reflects time of care of this signee Dr Jennet Maduro. This critical care time does not reflect procedure time, or teaching time or supervisory time of PA/NP/Med student/Med Resident etc but could involve care discussion time.  Rush Farmer, M.D. [facility] Pulmonary/Critical Care Medicine. Pager: [phone]. After hours pager: [phone].

## 2018-05-30 NOTE — Plan of Care (Signed)
  Problem: Nutrition: Goal: Adequate nutrition will be maintained Outcome: Progressing   Problem: Coping: Goal: Level of anxiety will decrease Outcome: Progressing   Problem: Elimination: Goal: Will not experience complications related to urinary retention Outcome: Progressing   Problem: Safety: Goal: Ability to remain free from injury will improve Outcome: Progressing   Problem: Respiratory: Goal: Ability to maintain a clear airway and adequate ventilation will improve Outcome: Progressing

## 2018-05-30 NOTE — Progress Notes (Signed)
Pharmacy Antibiotic Note  Angel Hinton is a 77 y.o. female admitted on 05/23/2018 with CP and SOB. Pharmacy has been consulted for Zosyn dosing due to possible aspiration and recent fevers. Plans are to continue zosyn until 10/16 -WBC= 13, afebrile, cultures- ngtd   Plan: - Continue Zosyn 3.375g Q8h until 10/17 - Monitor cultures and renal function   Height: 5\' 5"  (165.1 cm) Weight: 102 lb 15.3 oz (46.7 kg) IBW/kg (Calculated) : 57  Temp (24hrs), Avg:98.3 F (36.8 C), Min:98 F (36.7 C), Max:98.8 F (37.1 C)  Recent Labs  Lab 05/23/18 0919  05/24/18 1059  05/25/18 0500  05/26/18 0446 05/27/18 0327 05/28/18 0406 05/29/18 0353 05/29/18 0647 05/29/18 1815 05/30/18 0457  WBC  --   --   --    < > 25.7*  --  23.0* 17.8* 16.4* 13.5*  --   --  13.0*  CREATININE  --    < >  --   --  0.43*   < > 0.35* 0.37* 0.41*  --  0.36* 0.35*  --   LATICACIDVEN 4.1*  --  1.3  --  1.6  --   --   --   --   --   --   --   --    < > = values in this interval not displayed.    Estimated Creatinine Clearance: 44.8 mL/min (A) (by C-G formula based on SCr of 0.35 mg/dL (L)).    Allergies  Allergen Reactions  . Erythromycin Anaphylaxis  . Amoxicillin Other (See Comments)    Not sure  . Amoxicillin-Pot Clavulanate Other (See Comments)    Not sure  . Prednisone     Elevates bp  . Spiriva Handihaler [Tiotropium Bromide Monohydrate]     Antimicrobials this admission: Zosyn 10/8>>(10/16)  Dose adjustments this admission: N/A  Microbiology results: 10/7 BCx: NG3D 10/8 UCx: neg 10/7 MRSA PCR: neg   Hildred Laser, PharmD Clinical Pharmacist Please check Amion for pharmacy contact number

## 2018-05-30 NOTE — Progress Notes (Signed)
This note also relates to the following rows which could not be included: Pulse Rate - Cannot attach notes to unvalidated device data Resp - Cannot attach notes to unvalidated device data SpO2 - Cannot attach notes to unvalidated device data  Increase mad edue to desat.  RT will wean as tolerated.

## 2018-05-31 ENCOUNTER — Inpatient Hospital Stay (HOSPITAL_COMMUNITY): Payer: Medicare Other

## 2018-05-31 DIAGNOSIS — E43 Unspecified severe protein-calorie malnutrition: Secondary | ICD-10-CM

## 2018-05-31 DIAGNOSIS — Z515 Encounter for palliative care: Secondary | ICD-10-CM

## 2018-05-31 DIAGNOSIS — Z7189 Other specified counseling: Secondary | ICD-10-CM

## 2018-05-31 DIAGNOSIS — J441 Chronic obstructive pulmonary disease with (acute) exacerbation: Secondary | ICD-10-CM

## 2018-05-31 LAB — GLUCOSE, CAPILLARY
GLUCOSE-CAPILLARY: 127 mg/dL — AB (ref 70–99)
Glucose-Capillary: 128 mg/dL — ABNORMAL HIGH (ref 70–99)
Glucose-Capillary: 77 mg/dL (ref 70–99)
Glucose-Capillary: 115 mg/dL — ABNORMAL HIGH (ref 70–99)
Glucose-Capillary: 165 mg/dL — ABNORMAL HIGH (ref 70–99)

## 2018-05-31 LAB — BASIC METABOLIC PANEL
Anion gap: 12 (ref 5–15)
BUN: 18 mg/dL (ref 8–23)
CO2: 39 mmol/L — ABNORMAL HIGH (ref 22–32)
Calcium: 9 mg/dL (ref 8.9–10.3)
Chloride: 94 mmol/L — ABNORMAL LOW (ref 98–111)
Creatinine, Ser: 0.33 mg/dL — ABNORMAL LOW (ref 0.44–1.00)
Glucose, Bld: 136 mg/dL — ABNORMAL HIGH (ref 70–99)
Potassium: 3.3 mmol/L — ABNORMAL LOW (ref 3.5–5.1)
SODIUM: 145 mmol/L (ref 135–145)

## 2018-05-31 LAB — POCT I-STAT 3, ART BLOOD GAS (G3+)
Acid-Base Excess: 16 mmol/L — ABNORMAL HIGH (ref 0.0–2.0)
BICARBONATE: 42.4 mmol/L — AB (ref 20.0–28.0)
O2 SAT: 94 %
PCO2 ART: 62.7 mmHg — AB (ref 32.0–48.0)
Patient temperature: 98.6
TCO2: 44 mmol/L — AB (ref 22–32)
pH, Arterial: 7.438 (ref 7.350–7.450)
pO2, Arterial: 71 mmHg — ABNORMAL LOW (ref 83.0–108.0)

## 2018-05-31 LAB — CBC
HCT: 26.9 % — ABNORMAL LOW (ref 36.0–46.0)
Hemoglobin: 8.1 g/dL — ABNORMAL LOW (ref 12.0–15.0)
MCH: 28.7 pg (ref 26.0–34.0)
MCHC: 30.1 g/dL (ref 30.0–36.0)
MCV: 95.4 fL (ref 80.0–100.0)
PLATELETS: 368 10*3/uL (ref 150–400)
RBC: 2.82 MIL/uL — AB (ref 3.87–5.11)
RDW: 14.6 % (ref 11.5–15.5)
WBC: 15.1 10*3/uL — ABNORMAL HIGH (ref 4.0–10.5)
nRBC: 0 % (ref 0.0–0.2)

## 2018-05-31 LAB — MAGNESIUM: MAGNESIUM: 1.7 mg/dL (ref 1.7–2.4)

## 2018-05-31 LAB — COOXEMETRY PANEL
Carboxyhemoglobin: 2 % — ABNORMAL HIGH (ref 0.5–1.5)
Methemoglobin: 1.4 % (ref 0.0–1.5)
O2 SAT: 78.6 %
TOTAL HEMOGLOBIN: 8.8 g/dL — AB (ref 12.0–16.0)

## 2018-05-31 LAB — PHOSPHORUS: Phosphorus: 2.8 mg/dL (ref 2.5–4.6)

## 2018-05-31 MED ORDER — POTASSIUM CHLORIDE 20 MEQ PO PACK: 40.0000 meq | PACK | Freq: Once | ORAL | Status: DC

## 2018-05-31 MED ORDER — MAGNESIUM SULFATE 2 GM/50ML IV SOLN
2.0000 g | Freq: Once | INTRAVENOUS | Status: AC
  Administered 2018-05-31: 2 g via INTRAVENOUS
  Filled 2018-05-31: qty 50

## 2018-05-31 MED ORDER — FUROSEMIDE 10 MG/ML IJ SOLN
40.0000 mg | Freq: Once | INTRAMUSCULAR | Status: AC
  Administered 2018-05-31: 40 mg via INTRAVENOUS
  Filled 2018-05-31: qty 4

## 2018-05-31 MED ORDER — CHLORHEXIDINE GLUCONATE 0.12 % MT SOLN
OROMUCOSAL | Status: AC
  Filled 2018-05-31: qty 15

## 2018-05-31 MED ORDER — POTASSIUM CHLORIDE 20 MEQ PO PACK
60.0000 meq | PACK | Freq: Once | ORAL | Status: AC
  Administered 2018-05-31: 60 meq
  Filled 2018-05-31: qty 3

## 2018-05-31 NOTE — Progress Notes (Signed)
Family meeting with sister and 3 other family members.  Presented the case to them with concern for worsening respiratory failure and severe deconditioning.  Explained that the patient's chances at extubation successfully are not great and that I am concerned she would progress to respiratory failure again.  After a long discussion they asked to speak with the patient that in my opinion was mentating well and responding.  We represented the case again to the patient and family.  Patient asked for sometime to decide.  I left them to converse for 30 minutes and returned.  After another discussion, decision was made to proceed with extubation in AM as the patient weaning.  Will give an additional dose of lasix and insert foley for quantification of I/O.  If patient appears well tomorrow for extubation then will extubate and if fails will reintubate and trach.  If does not appear well enough for extubation in AM then will discuss proceeding with trach.  Many questions about disposition post trach but I believe that is a little premature.  Total CC time for today 90 minutes.  Rush Farmer, M.D. [facility] Pulmonary/Critical Care Medicine. Pager: [phone]. After hours pager: [phone].

## 2018-05-31 NOTE — Progress Notes (Addendum)
PULMONARY / CRITICAL CARE MEDICINE   NAME:  Angel Hinton, MRN:  [id number], DOB:  04-24-1941, LOS: 8 ADMISSION DATE:  05/23/2018, CONSULTATION DATE:  REFERRING MD:  ED, CHIEF COMPLAINT: Dyspnea  BRIEF HISTORY:         This is a 77 year old with severe COPD at baseline who presented with dyspnea and desaturation.  She was found to have EKG changes consistent with an acute inferior myocardial infarction but due to her concurrent morbidities it was elected not to perform cardiac catheterization.  She was intubated and found to have a tension pneumothorax.  A chest tube has been placed.  Initial troponin was only 0.09, .  HISTORY OF PRESENT ILLNESS        77 yr old female w/ PMHx of severe COPD (last FEV1 37%, DLCO 27) not compliant with any bronchodilators, On home oxygen last seen in Pulmonary clinic 07/2017. Presents with SOB, Sats 38%-> increased to 72% on 4L [place] with no complaint of chest pain. Found on EKG to have ST elevations in the inferior and precordial leads.   Troponin on presentation rose to in excess of 5.  Echo shows poor left ventricular function with an LV ejection fraction of 20 to 25%.  She was found to have a tension pneumothorax following  intubation and a right-sided pigtail was placed.  She subsequently developed fevers and leukocytosis with an elevated procalcitonin.  She has been started empirically on Zosyn.  On 10/8 she had an episode of SVT associated with hypotension and she was started on amiodarone infusion.  Past Medical History   Past Medical History:  Diagnosis Date  . CHF (congestive heart failure) ([place])   . COPD (chronic obstructive pulmonary disease) ([facility])     SIGNIFICANT PAST MEDICAL HISTORY   Severe COPD, CHF, coronary hypertension  SIGNIFICANT EVENTS:  Intubated 10/7 STUDIES:   Echo 10/7>> Normal LV size with EF 25-30%. Wall motion abnormalities as noted   above. Pattern is suggestive of stress (Takotsubo) cardiomyopathy   versus severe multivessel  coronary disease. Moderate diastolic   dysfunction. Normal RV size and systolic function. Dilated IVC   suggestive of elevated RV filling pressure. Pulmonary arteries: PA peak pressure: 39 mm Hg (S). CULTURES:  10/7: Respiratory culture pending 10/7>> MRSA PCR >> Neg 10/7 Blood Culture >> Pending  ANTIBIOTICS:  Zosyn   LINES/TUBES:   Pigtail chest tube right placed 10/7>>> ETT 10/7>>> L IJ TLC 10/8>>>  CONSULTANTS:  Cardiology 10/7  SUBJECTIVE:  No acute events overnight.  FiO2 is down to 50% PEEP of 8    CONSTITUTIONAL: BP (!) 93/37   Pulse 89   Temp 98.1 F (36.7 C) (Oral)   Resp 16   Ht 5\' 5"  (1.651 m)   Wt 46.7 kg   SpO2 (!) 81%   BMI 17.13 kg/m   I/O last 3 completed shifts: In: 2985.1 [I.V.:590.8; Other:60; NG/GT:2055; IV Piggyback:279.3] Out: 2230 [Urine:2090; Chest Tube:140]  CVP:  [9 mmHg] 9 mmHg  Vent Mode: PRVC FiO2 (%):  [40 %-50 %] 50 % Set Rate:  [16 bmp] 16 bmp Vt Set:  [450 mL] 450 mL PEEP:  [8 cmH20] 8 cmH20 Pressure Support:  [8 cmH20] 8 cmH20 Plateau Pressure:  [18 CBJ62-83 cmH20] 20 cmH20  PHYSICAL EXAM: General: Frail cachectic ill-appearing female HEENT: Tracheal tube orogastric tube in place Neuro: Follows some commands intermittently CV: Sounds are distant PULM: Breath sounds in the bases currently on full mechanical ventilatory support, right chest tube without air leak TD:VVOH, non-tender, bsx4  active tube feedings infusion Extremities: warm/dry, plus edema  Skin: no rashes or lesions, muscle wasting   RESOLVED PROBLEM LIST   ASSESSMENT AND PLAN   Acute on Chronic Respiratory Failure 2/2 tension pneumothorax Unfortunately she has severe COPD at baseline, she suffered from a right tension pneumothorax, and she now has severe left ventricular systolic dysfunction with fluid overload all contributing to her respiratory failure.    PEEP is now at 8 Titrate FiO2 to keep sats 88 to 92% Consider continue diuresis as creatinine  tolerates it well No air leak is noted on chest tube on 05/31/2018 Consider removing chest tube or if she is extubated anytime soon   Elevated Troponin Likely has substantial underlying CAD and I am going to continue her on aspirin and statin.  .   Echo with EF 25-30%, Pattern is suggestive of stress (Takotsubo) cardiomyopathy versus severe multivessel coronary disease. Moderate diastolic dysfunction.   Cardiology is following Wean Levophed Was diuresed 982 cc on 1014 He was noted to be 9 On low-dose Coreg  Renal Lab Results  Component Value Date   CREATININE 0.33 (L) 05/31/2018   CREATININE 0.39 (L) 05/30/2018   CREATININE 0.35 (L) 05/29/2018   Recent Labs  Lab 05/29/18 1815 05/30/18 1023 05/31/18 0447  K 4.5 3.5 3.3*    Intake/Output Summary (Last 24 hours) at 05/31/2018 0932 Last data filed at 05/31/2018 0900 Gross per 24 hour  Intake 2027.95 ml  Output 3010 ml  Net -982.05 ml    Underwent diuresis on 05/30/2018 with a -982 cc Potassium slightly low at 3.3 will replete for feeding tube Renal function remains adequate    ID Fever resolved and infiltrate is difficult to ascertain Procalcitonin 0.22 Zosyn was discontinued on 05/30/2018 Monitor temperature and fever curve   GI/ Nutrition Pt is cachectic. FTT Body mass index is 17.13 kg/m. Plan:   Tube feedings at 45 cc an hour  Goals of Care Per cards note 10/7, patient is not good candidate for cath. Need goals of care discussion after extubation as patient is alert and appropriate at present and should be able to make her own decision. Sister contacted for [place], will recommend comfort if remains with such high vent demand and DNR status.  SUMMARY OF TODAY'S PLAN:  Continue full mechanical ventilatory support Ongoing discussion and goals of care Right chest tube remains in place without air leak Levophed is weaning  Best Practice / Goals of Care / Disposition.   DVT PROPHYLAXIS: Systemic dose  heparin AYT:[id number] NUTRITION: Tube feedings at 45 cc an hour MOBILITY:BR/ OOB to chair GOALS OF CARE: Ongoing discussion  LABS  Glucose Recent Labs  Lab 05/30/18 1123 05/30/18 1515 05/30/18 1939 05/30/18 2326 05/31/18 0357 05/31/18 0736  GLUCAP 116* 118* 122* 89 128* 77    BMET Recent Labs  Lab 05/29/18 1815 05/30/18 1023 05/31/18 0447  NA 142 143 145  K 4.5 3.5 3.3*  CL 98 97* 94*  CO2 35* 39* 39*  BUN 16 16 18   CREATININE 0.35* 0.39* 0.33*  GLUCOSE 110* 124* 136*    Liver Enzymes Recent Labs  Lab 05/25/18 0500 05/28/18 0406 05/29/18 1815  AST 30 16 17   ALT 25 22 19   ALKPHOS 48 80 64  BILITOT 1.0 0.7 0.7  ALBUMIN 2.5* 2.1* 3.0*    Electrolytes Recent Labs  Lab 05/28/18 0406  05/29/18 1815 05/30/18 0457 05/30/18 1023 05/31/18 0447  CALCIUM 8.1*   < > 8.7*  --  8.8* 9.0  MG 1.9  --   --  1.9  --  1.7  PHOS 2.6  --   --  2.8  --  2.8   < > = values in this interval not displayed.    CBC Recent Labs  Lab 05/29/18 0353 05/30/18 0457 05/31/18 0447  WBC 13.5* 13.0* 15.1*  HGB 8.8* 7.8* 8.1*  HCT 29.8* 26.3* 26.9*  PLT 225 327 368    ABG Recent Labs  Lab 05/28/18 0005 05/29/18 0349 05/31/18 0341  PHART 7.374 7.483* 7.438  PCO2ART 51.8* 47.2 62.7*  PO2ART 63.0* 63.0* 71.0*    Coag's No results for input(s): APTT, INR in the last 168 hours.  Sepsis Markers Recent Labs  Lab 05/24/18 1059 05/25/18 0500 05/28/18 0406 05/29/18 0647 05/30/18 0457  LATICACIDVEN 1.3 1.6  --   --   --   PROCALCITON 1.41 3.30 0.69 0.45 0.22    Cardiac Enzymes Recent Labs  Lab 05/24/18 1248 05/25/18 0500  TROPONINI 3.99* 1.84*   Critical care time 34 minutes per NP.SMS  Richardson Landry Minor ACNP Maryanna Shape PCCM Pager [phone] till 1 pm If no answer page [phone] 05/31/2018, 9:23 AM  Attending Note:  77 year old female with ES-COPD who presents with respiratory failure due to COPD and tension PTX.  The patient is able to wean some but not  enough to extubate.  On exam, she is more awake but not weaning very well.  I reviewed CXR myself, hyperinflation noted with ETT in a good position.  Discussed with PCCM-NP and bedside RN.  Awaiting sister arrival at 34 PM today to discuss code status and plan of care.  Pending discussion will need trach placement vs one way intubation.  Continue abx for now and weaning efforts.  Keep dry as able.  The patient is critically ill with multiple organ systems failure and requires high complexity decision making for assessment and support, frequent evaluation and titration of therapies, application of advanced monitoring technologies and extensive interpretation of multiple databases.   Critical Care Time devoted to patient care services described in this note is  32  Minutes. This time reflects time of care of this signee Dr Jennet Maduro. This critical care time does not reflect procedure time, or teaching time or supervisory time of PA/NP/Med student/Med Resident etc but could involve care discussion time.  Rush Farmer, M.D. [facility] Pulmonary/Critical Care Medicine. Pager: [phone]. After hours pager: [phone].

## 2018-05-31 NOTE — Progress Notes (Signed)
Pt sister Hassan Rowan contacted per MD request. Sister to arrive on unit after radiation treatment around 1430. MD to be made aware. Family meeting to take place to discuss plan of care.

## 2018-05-31 NOTE — Plan of Care (Signed)
  Problem: Education: Goal: Knowledge of General Education information will improve Description: Including pain rating scale, medication(s)/side effects and non-pharmacologic comfort measures Outcome: Progressing   Problem: Clinical Measurements: Goal: Will remain free from infection Outcome: Progressing   Problem: Nutrition: Goal: Adequate nutrition will be maintained Outcome: Progressing   Problem: Coping: Goal: Level of anxiety will decrease Outcome: Progressing   Problem: Safety: Goal: Ability to remain free from injury will improve Outcome: Progressing   Problem: Skin Integrity: Goal: Risk for impaired skin integrity will decrease Outcome: Progressing   

## 2018-05-31 NOTE — Plan of Care (Signed)
Care plan reviewed.

## 2018-05-31 NOTE — Progress Notes (Signed)
Called [facility]  Spoke w/Dr. Oletta Darter... Asked if we can get an order for an I/O cath  Pt has not had any UO since 2200.... Bladder scanned her and it was >999 L  Pt is asymptomatic and in no acute distress.

## 2018-05-31 NOTE — Progress Notes (Signed)
Bladder scanned patient due to decreased urine output and abdominal distention. Bladder scanner >999. I/O urine output 1.2L. Pt abd soft post I/O. Rn will continue to monitor.

## 2018-05-31 NOTE — Progress Notes (Signed)
[place] Progress Note Patient Name: Angel Hinton DOB: Jan 14, 1941 MRN: [id number]   Date of Service  05/31/2018  HPI/Events of Note  Oliguria - Bladder scan with > 999 mL residual.   eICU Interventions  Will order: 1. I/O cath PRN.     Intervention Category Intermediate Interventions: Oliguria - evaluation and management  Angel Hinton 05/31/2018, 4:00 AM

## 2018-05-31 NOTE — Progress Notes (Signed)
Family meeting completed at this time. Family decided to give patient a chance with extubation in AM. If patient does not tolerate, patient will be re-intubated and trached at bedside per patient and family request.

## 2018-06-01 ENCOUNTER — Inpatient Hospital Stay (HOSPITAL_COMMUNITY): Payer: Medicare Other

## 2018-06-01 DIAGNOSIS — J810 Acute pulmonary edema: Secondary | ICD-10-CM

## 2018-06-01 LAB — GLUCOSE, CAPILLARY
GLUCOSE-CAPILLARY: 127 mg/dL — AB (ref 70–99)
GLUCOSE-CAPILLARY: 123 mg/dL — AB (ref 70–99)
Glucose-Capillary: 123 mg/dL — ABNORMAL HIGH (ref 70–99)
Glucose-Capillary: 129 mg/dL — ABNORMAL HIGH (ref 70–99)
Glucose-Capillary: 140 mg/dL — ABNORMAL HIGH (ref 70–99)
Glucose-Capillary: 154 mg/dL — ABNORMAL HIGH (ref 70–99)

## 2018-06-01 LAB — BASIC METABOLIC PANEL
Anion gap: 4 — ABNORMAL LOW (ref 5–15)
BUN: 24 mg/dL — ABNORMAL HIGH (ref 8–23)
CALCIUM: 8.9 mg/dL (ref 8.9–10.3)
CO2: 41 mmol/L — AB (ref 22–32)
CREATININE: 0.32 mg/dL — AB (ref 0.44–1.00)
Chloride: 98 mmol/L (ref 98–111)
GFR calc Af Amer: >60 mL/min (ref >60)
GFR calc non Af Amer: >60 mL/min (ref >60)
GLUCOSE: 137 mg/dL — AB (ref 70–99)
Potassium: 4.2 mmol/L (ref 3.5–5.1)
Sodium: 143 mmol/L (ref 135–145)

## 2018-06-01 LAB — PHOSPHORUS: Phosphorus: 2.8 mg/dL (ref 2.5–4.6)

## 2018-06-01 LAB — MAGNESIUM: Magnesium: 2.3 mg/dL (ref 1.7–2.4)

## 2018-06-01 MED ORDER — PROPOFOL 500 MG/50ML IV EMUL
5.0000 ug/kg/min | Freq: Once | INTRAVENOUS | Status: AC
  Administered 2018-06-01: 5 ug/kg/min via INTRAVENOUS
  Filled 2018-06-01: qty 50

## 2018-06-01 MED ORDER — FENTANYL CITRATE (PF) 100 MCG/2ML IJ SOLN: 200.0000 ug | Freq: Once | INTRAMUSCULAR | Status: AC

## 2018-06-01 MED ORDER — MIDAZOLAM HCL 2 MG/2ML IJ SOLN
4.0000 mg | Freq: Once | INTRAMUSCULAR | Status: AC
  Administered 2018-06-01: 2 mg via INTRAVENOUS

## 2018-06-01 MED ORDER — ALBUMIN HUMAN 25 % IV SOLN
25.0000 g | Freq: Four times a day (QID) | INTRAVENOUS | Status: AC
  Administered 2018-06-01 – 2018-06-02 (×4): 25 g via INTRAVENOUS
  Filled 2018-06-01 (×4): qty 50

## 2018-06-01 MED ORDER — POTASSIUM CHLORIDE 20 MEQ/15ML (10%) PO SOLN
40.0000 meq | Freq: Three times a day (TID) | ORAL | Status: AC
  Administered 2018-06-01 (×2): 40 meq
  Filled 2018-06-01 (×2): qty 30

## 2018-06-01 MED ORDER — METOLAZONE 5 MG PO TABS
5.0000 mg | ORAL_TABLET | Freq: Every day | ORAL | Status: AC
  Administered 2018-06-01: 5 mg via ORAL
  Filled 2018-06-01: qty 1

## 2018-06-01 MED ORDER — FUROSEMIDE 10 MG/ML IJ SOLN
40.0000 mg | Freq: Four times a day (QID) | INTRAMUSCULAR | Status: AC
  Administered 2018-06-01 (×3): 40 mg via INTRAVENOUS
  Filled 2018-06-01 (×3): qty 4

## 2018-06-01 MED ORDER — ETOMIDATE 2 MG/ML IV SOLN
40.0000 mg | Freq: Once | INTRAVENOUS | Status: AC
  Administered 2018-06-01: 20 mg via INTRAVENOUS

## 2018-06-01 MED ORDER — VECURONIUM BROMIDE 10 MG IV SOLR: 10.0000 mg | Freq: Once | INTRAVENOUS | Status: DC

## 2018-06-01 NOTE — Progress Notes (Addendum)
Nutrition Follow Up  DOCUMENTATION CODES:   Severe malnutrition in context of chronic illness, Underweight  INTERVENTION:    Continue Vital AF 1.2 at goal rate of 45 ml/hr  Provides 1296 kcals, 81 gm protein, 875 ml of free water  NUTRITION DIAGNOSIS:   Severe Malnutrition related to chronic illness(severe COPD) as evidenced by severe fat depletion, severe muscle depletion, ongoing  GOAL:   Patient will meet greater than or equal to 90% of their needs, met  MONITOR:   Vent status, TF tolerance, Labs, Skin, Weight trends, I & O's  ASSESSMENT:   77 yo Female with advanced COPD who presented with acute dyspnea and desaturation was found to have a right tension pneumothorax.  Patient is currently on ventilator support MV: 7.3 L/min Temp (24hrs), Avg:98.4 F (36.9 C), Min:97.6 F (36.4 C), Max:98.9 F (37.2 C)  Pt s/p bedside trach this AM.   Vital AF 1.2 currently infusing at goal rate of 45 ml/hr via NGT. Pt continues to tolerate TF regimen well.  Acute on chronic respiratory failure. Severe L ventricular systolic dysfunction with fluid overload. Labs & medications reviewed. Mg, Phos, K+ WNL. CBG's [phone].  Diet Order:   Diet Order            Diet NPO time specified  Diet effective now             EDUCATION NEEDS:   Not appropriate for education at this time  Skin:  Skin Assessment: Skin Integrity Issues: Skin Integrity Issues:: Stage I Stage I: bilateral heels  Last BM:  10/15    Intake/Output Summary (Last 24 hours) at 06/01/2018 1419 Last data filed at 06/01/2018 1300 Gross per 24 hour  Intake 1493.61 ml  Output 2140 ml  Net -646.39 ml   Height:   Ht Readings from Last 1 Encounters:  05/29/18 5' 5"  (1.651 m)   Weight:   Wt Readings from Last 1 Encounters:  06/01/18 45.1 kg   Ideal Body Weight:  56.8 kg  BMI:  Body mass index is 16.55 kg/m.  Estimated Nutritional Needs:   Kcal:  1140  Protein:  75-90 gm  Fluid:  per  MD  Arthur Holms, RD, LDN Pager #: [phone] After-Hours Pager #: [phone]

## 2018-06-01 NOTE — Progress Notes (Signed)
PULMONARY / CRITICAL CARE MEDICINE   NAME:  Angel Hinton, MRN:  [id number], DOB:  [birth year], LOS: 9 ADMISSION DATE:  05/23/2018, CONSULTATION DATE:  REFERRING MD:  ED, CHIEF COMPLAINT: Dyspnea  BRIEF HISTORY:         This is a 77 year old with severe COPD at baseline who presented with dyspnea and desaturation.  She was found to have EKG changes consistent with an acute inferior myocardial infarction but due to her concurrent morbidities it was elected not to perform cardiac catheterization.  She was intubated and found to have a tension pneumothorax.  A chest tube has been placed.  Initial troponin was only 0.09, .  HISTORY OF PRESENT ILLNESS        77 yr old female w/ PMHx of severe COPD (last FEV1 37%, DLCO 27) not compliant with any bronchodilators, On home oxygen last seen in Pulmonary clinic 07/2017. Presents with SOB, Sats 38%-> increased to 72% on 4L [place] with no complaint of chest pain. Found on EKG to have ST elevations in the inferior and precordial leads.   Troponin on presentation rose to in excess of 5.  Echo shows poor left ventricular function with an LV ejection fraction of 20 to 25%.  She was found to have a tension pneumothorax following  intubation and a right-sided pigtail was placed.  She subsequently developed fevers and leukocytosis with an elevated procalcitonin.  She has been started empirically on Zosyn.  On 10/8 she had an episode of SVT associated with hypotension and she was started on amiodarone infusion.  SIGNIFICANT EVENTS:  Intubated 10/7>>>10/16 Trach 10/16>>>  STUDIES:   Echo 10/7>> Normal LV size with EF 25-30%. Wall motion abnormalities as noted   above. Pattern is suggestive of stress (Takotsubo) cardiomyopathy   versus severe multivessel coronary disease. Moderate diastolic   dysfunction. Normal RV size and systolic function. Dilated IVC   suggestive of elevated RV filling pressure. Pulmonary arteries: PA peak pressure: 39 mm Hg (S).  CULTURES:  10/7:  Respiratory culture pending 10/7>> MRSA PCR >> Neg 10/7 Blood Culture >> Pending  ANTIBIOTICS:  Zosyn 10/x>>>10/14  LINES/TUBES:   Pigtail chest tube right placed 10/7>>> ETT 10/7>>>10/16 Trach (JY) 10/16>>> L IJ TLC 10/8>>>  CONSULTANTS:  Cardiology 10/7  SUBJECTIVE:  Did not tolerate wean this AM    CONSTITUTIONAL: BP (!) 117/47   Pulse 85   Temp 98.4 F (36.9 C) (Oral)   Resp 17   Ht 5\' 5"  (1.651 m)   Wt 45.1 kg   SpO2 100%   BMI 16.55 kg/m   I/O last 3 completed shifts: In: 2354.2 [I.V.:540; NG/GT:1685; IV Piggyback:129.2] Out: 2970 [Urine:2940; Chest Tube:30]  CVP:  [4 mmHg-7 mmHg] 4 mmHg  Vent Mode: PRVC FiO2 (%):  [50 %] 50 % Set Rate:  [16 bmp] 16 bmp Vt Set:  [450 mL] 450 mL PEEP:  [5 cmH20-8 cmH20] 8 cmH20 Pressure Support:  [5 cmH20] 5 cmH20 Plateau Pressure:  [11 cmH20-20 cmH20] 11 cmH20  PHYSICAL EXAM: General: Frail, awake and interactive, moving all ext to command HEENT: [place]/AT, PERRL, EOM-I and MMM Neuro: Alert and interactive, moving all ext to commands CV: RRR, Nl S1/S2 and -M/R/G PULM: Distant BS GI: Soft, NT, ND and +BS Extremities: warm/dry, plus edema  Skin: no rashes or lesions, muscle wasting  RESOLVED PROBLEM LIST   ASSESSMENT AND PLAN   Acute on Chronic Respiratory Failure 2/2 tension pneumothorax Unfortunately she has severe COPD at baseline, she suffered from a right tension pneumothorax, and  she now has severe left ventricular systolic dysfunction with fluid overload all contributing to her respiratory failure.    Unable to extubate, place tracheostomy today per family's and patient discussion yesterday and today Titrate O2 for sat of 88-92% Active diureses again today No air leak is noted on chest tube on 06/01/2018 but remains on positive pressure ventilation CT to be removed once able to tolerate spontaneous breathing of any sort  Elevated Troponin Likely has substantial underlying CAD and I am going to continue her  on aspirin and statin.  .   Echo with EF 25-30%, Pattern is suggestive of stress (Takotsubo) cardiomyopathy versus severe multivessel coronary disease. Moderate diastolic dysfunction.   Cardiology is following Wean levophed as able Continue active diureses Use albumin today only On low-dose Coreg  Renal Lab Results  Component Value Date   CREATININE 0.32 (L) 06/01/2018   CREATININE 0.33 (L) 05/31/2018   CREATININE 0.39 (L) 05/30/2018   Recent Labs  Lab 05/30/18 1023 05/31/18 0447 06/01/18 0426  K 3.5 3.3* 4.2    Intake/Output Summary (Last 24 hours) at 06/01/2018 0952 Last data filed at 06/01/2018 0919 Gross per 24 hour  Intake 1491.14 ml  Output 1580 ml  Net -88.86 ml   Lasix 40 mg IV q6 x3 doses Albumin Potassium replacement Renal function remains adequate   ID Fever resolved and infiltrate is difficult to ascertain Procalcitonin 0.22 Zosyn was discontinued on 05/30/2018 Monitor temperature and fever curve  GI/ Nutrition Pt is cachectic. FTT Body mass index is 16.55 kg/m.  Plan: Tube feedings at 45 cc an hour  Goals of Care Full code status  SUMMARY OF TODAY'S PLAN:  Trach today Increase lasix today Begin weaning efforts in AM  Best Practice / Goals of Care / Disposition.   DVT PROPHYLAXIS: Systemic dose heparin [id number] NUTRITION: Tube feedings at 45 cc an hour MOBILITY:BR/ OOB to chair GOALS OF CARE: Ongoing discussion  LABS  Glucose Recent Labs  Lab 05/31/18 1123 05/31/18 1530 05/31/18 1938 06/01/18 0001 06/01/18 0337 06/01/18 0747  GLUCAP 127* 115* 165* 140* 123* 127*    BMET Recent Labs  Lab 05/30/18 1023 05/31/18 0447 06/01/18 0426  NA 143 145 143  K 3.5 3.3* 4.2  CL 97* 94* 98  CO2 39* 39* 41*  BUN 16 18 24*  CREATININE 0.39* 0.33* 0.32*  GLUCOSE 124* 136* 137*    Liver Enzymes Recent Labs  Lab 05/28/18 0406 05/29/18 1815  AST 16 17  ALT 22 19  ALKPHOS 80 64  BILITOT 0.7 0.7  ALBUMIN 2.1* 3.0*     Electrolytes Recent Labs  Lab 05/30/18 0457 05/30/18 1023 05/31/18 0447 06/01/18 0426  CALCIUM  --  8.8* 9.0 8.9  MG 1.9  --  1.7 2.3  PHOS 2.8  --  2.8 2.8    CBC Recent Labs  Lab 05/29/18 0353 05/30/18 0457 05/31/18 0447  WBC 13.5* 13.0* 15.1*  HGB 8.8* 7.8* 8.1*  HCT 29.8* 26.3* 26.9*  PLT 225 327 368    ABG Recent Labs  Lab 05/28/18 0005 05/29/18 0349 05/31/18 0341  PHART 7.374 7.483* 7.438  PCO2ART 51.8* 47.2 62.7*  PO2ART 63.0* 63.0* 71.0*    Coag's No results for input(s): APTT, INR in the last 168 hours.  Sepsis Markers Recent Labs  Lab 05/28/18 0406 05/29/18 0647 05/30/18 0457  PROCALCITON 0.69 0.45 0.22    Cardiac Enzymes No results for input(s): TROPONINI, PROBNP in the last 168 hours.  The patient is critically ill with multiple  organ systems failure and requires high complexity decision making for assessment and support, frequent evaluation and titration of therapies, application of advanced monitoring technologies and extensive interpretation of multiple databases.   Critical Care Time devoted to patient care services described in this note is  33  Minutes. This time reflects time of care of this signee Dr Jennet Maduro. This critical care time does not reflect procedure time, or teaching time or supervisory time of PA/NP/Med student/Med Resident etc but could involve care discussion time.  Rush Farmer, M.D. [facility] Pulmonary/Critical Care Medicine. Pager: [phone]. After hours pager: [phone].

## 2018-06-01 NOTE — Plan of Care (Signed)
Care plan reviewed.

## 2018-06-01 NOTE — Procedures (Signed)
Bronchoscopy Procedure Note Angel Hinton [id number] 1941/02/02  Procedure: Bronchoscopy Indications: Respiratory failure.  Performed to facilitate percutaneous tracheostomy  Procedure Details Consent: Performed Time Out: Verified patient identification, verified procedure, site/side was marked, verified correct patient position, special equipment/implants available, medications/allergies/relevent history reviewed, required imaging and test results available.  Performed  In preparation for procedure, patient was given 100% FiO2. Sedation: Benzodiazepines and Muscle relaxants  Airway entered and the following bronchi were examined: RUL, RML, RLL, LUL and LLL.    Video bronchoscopy performed to facilitate percutaneous tracheostomy.  Please refer also to Dr. Pura Spice procedure note.  The bronchoscope was introduced through the endotracheal tube and the ET tube was withdrawn to facilitate cannulation of the trachea.  This was observed in real-time there was no injury to the posterior wall or excessive bleeding.  Serial dilations were performed and the tracheostomy was secured in place.  The endotracheal tube was withdrawn and then an airway inspection was performed via the tracheostomy.  There was no significant bleeding noted.  No complications.  No samples were collected.  Evaluation Hemodynamic Status: BP stable throughout; O2 sats: stable throughout and transiently fell during during procedure Patient's Current Condition: stable Specimens:  None Complications: No apparent complications Patient did tolerate procedure well.   Angel Hinton Angel Hinton 06/01/2018

## 2018-06-01 NOTE — Care Management Note (Addendum)
Case Management Note  Patient Details  Name: Angel Hinton MRN: [id number] Date of Birth: [birth year]  Subjective/Objective:  77 yo female presented from home on 05/23/18 with SOB; found to have ST elevations. PMH: Severe COPD, home O2, pulmonary hypertension and decompensated CHF.Marland Kitchen                 Action/Plan: CM consult acknowledged; following patient throughout ICU stay. CM met with patient's 3 cousins, informed patient's sister is currently receiving an 8hr chemo treatment and wouldn't be available to discuss patient's POC until 06/02/18. Patient's cousin Kieth Brightly, inquired if a facility would accept patient in her current condition, verbalizing the patient's sister was concerned about her long term goals. CM discussed an LTACH, but deferred to f/u with patient's sister and discuss the POC with Dr. Nelda Marseille, with patient's family verbalizing understanding. CM spoke to Dr. Nelda Marseille and discussed the family's concerns; Dr. Nelda Marseille agrees with LTACH eval. CM consult for LTACH entered in Epic per verbal order from Dr. Nelda Marseille. CM will continue to follow.   Expected Discharge Date:                  Expected Discharge Plan:     In-House Referral:  NA  Discharge planning Services  CM Consult  Post Acute Care Choice:  NA Choice offered to:  NA  DME Arranged:  N/A DME Agency:  NA  HH Arranged:  NA HH Agency:  NA  Status of Service:  In process, will continue to follow  If discussed at Long Length of Stay Meetings, dates discussed:  06/02/18  Additional Comments: 06/02/18 @ 1615-Angel Hinton RNCM-CM met with patient's sister Angel Hinton to discuss LTACH preferences, with [facility]. CM updated care team and contacted Raquel Sarna, [facility] liaison with a bed available today. CM arranged [facility], completed Medical Necessity and packet for transport. EMTALA completed by Laurey Arrow NP; packet provided to Glennie Hawk, with primary nurse to contact [facility] for transport and [facility]  liaison to contact primary nurse for information needed for verbal report and bed details at facility.   06/02/18 @ 1421-Angel Hinton RNCM-CM attempted to reach patient's sister to discuss LTACH options with no answer at either numbers listed in Epic; sister also isn't present in patient's room. CM will attempt later today and continue to follow for needs.   Angel Minium RN, BSN, NCM-BC, ACM-RN [phone] 06/01/2018, 12:44 PM

## 2018-06-01 NOTE — Procedures (Signed)
Percutaneous Tracheostomy Placement  Consent from family.  Patient sedated, paralyzed and position.  Placed on 100% FiO2 and RR matched.  Area cleaned and draped.  Lidocaine/epi injected.  Skin incision done followed by blunt dissection.  Trachea palpated then punctured, catheter passed and visualized bronchoscopically.  Wire placed and visualized.  Catheter removed.  Airway then entered and dilated.  Size 6 cuffed shiley trach placed and visualized bronchoscopically well above carina.  Good volume returns.  Patient tolerated the procedure well without complications.  Minimal blood loss.  CXR ordered and pending.  Wesam G. Yacoub, M.D. [facility]. Pager: [phone]. After hours pager: [phone].  

## 2018-06-02 DIAGNOSIS — L89621 Pressure ulcer of left heel, stage 1: Secondary | ICD-10-CM | POA: Diagnosis present

## 2018-06-02 DIAGNOSIS — J930 Spontaneous tension pneumothorax: Secondary | ICD-10-CM | POA: Diagnosis not present

## 2018-06-02 DIAGNOSIS — L89611 Pressure ulcer of right heel, stage 1: Secondary | ICD-10-CM | POA: Diagnosis present

## 2018-06-02 DIAGNOSIS — I2119 ST elevation (STEMI) myocardial infarction involving other coronary artery of inferior wall: Secondary | ICD-10-CM | POA: Diagnosis not present

## 2018-06-02 DIAGNOSIS — J9600 Acute respiratory failure, unspecified whether with hypoxia or hypercapnia: Secondary | ICD-10-CM | POA: Diagnosis not present

## 2018-06-02 DIAGNOSIS — J9621 Acute and chronic respiratory failure with hypoxia: Secondary | ICD-10-CM | POA: Diagnosis present

## 2018-06-02 DIAGNOSIS — Z681 Body mass index (BMI) 19 or less, adult: Secondary | ICD-10-CM | POA: Diagnosis not present

## 2018-06-02 DIAGNOSIS — F0631 Mood disorder due to known physiological condition with depressive features: Secondary | ICD-10-CM | POA: Diagnosis present

## 2018-06-02 DIAGNOSIS — J9311 Primary spontaneous pneumothorax: Secondary | ICD-10-CM | POA: Diagnosis not present

## 2018-06-02 DIAGNOSIS — Z881 Allergy status to other antibiotic agents status: Secondary | ICD-10-CM | POA: Diagnosis not present

## 2018-06-02 DIAGNOSIS — I272 Pulmonary hypertension, unspecified: Secondary | ICD-10-CM | POA: Diagnosis not present

## 2018-06-02 DIAGNOSIS — J441 Chronic obstructive pulmonary disease with (acute) exacerbation: Secondary | ICD-10-CM | POA: Diagnosis not present

## 2018-06-02 DIAGNOSIS — I110 Hypertensive heart disease with heart failure: Secondary | ICD-10-CM | POA: Diagnosis not present

## 2018-06-02 DIAGNOSIS — Z9911 Dependence on respirator [ventilator] status: Secondary | ICD-10-CM | POA: Diagnosis not present

## 2018-06-02 DIAGNOSIS — R2689 Other abnormalities of gait and mobility: Secondary | ICD-10-CM | POA: Diagnosis not present

## 2018-06-02 DIAGNOSIS — I5181 Takotsubo syndrome: Secondary | ICD-10-CM | POA: Diagnosis present

## 2018-06-02 DIAGNOSIS — Z8701 Personal history of pneumonia (recurrent): Secondary | ICD-10-CM | POA: Diagnosis not present

## 2018-06-02 DIAGNOSIS — I469 Cardiac arrest, cause unspecified: Secondary | ICD-10-CM | POA: Diagnosis not present

## 2018-06-02 DIAGNOSIS — R5381 Other malaise: Secondary | ICD-10-CM | POA: Diagnosis not present

## 2018-06-02 DIAGNOSIS — R6521 Severe sepsis with septic shock: Secondary | ICD-10-CM | POA: Diagnosis not present

## 2018-06-02 DIAGNOSIS — R627 Adult failure to thrive: Secondary | ICD-10-CM | POA: Diagnosis not present

## 2018-06-02 DIAGNOSIS — J440 Chronic obstructive pulmonary disease with acute lower respiratory infection: Secondary | ICD-10-CM | POA: Diagnosis present

## 2018-06-02 DIAGNOSIS — A419 Sepsis, unspecified organism: Secondary | ICD-10-CM | POA: Diagnosis not present

## 2018-06-02 DIAGNOSIS — J939 Pneumothorax, unspecified: Secondary | ICD-10-CM | POA: Diagnosis present

## 2018-06-02 DIAGNOSIS — I2510 Atherosclerotic heart disease of native coronary artery without angina pectoris: Secondary | ICD-10-CM | POA: Diagnosis present

## 2018-06-02 DIAGNOSIS — J449 Chronic obstructive pulmonary disease, unspecified: Secondary | ICD-10-CM | POA: Diagnosis not present

## 2018-06-02 DIAGNOSIS — T50905A Adverse effect of unspecified drugs, medicaments and biological substances, initial encounter: Secondary | ICD-10-CM | POA: Diagnosis not present

## 2018-06-02 DIAGNOSIS — F419 Anxiety disorder, unspecified: Secondary | ICD-10-CM | POA: Diagnosis not present

## 2018-06-02 DIAGNOSIS — R739 Hyperglycemia, unspecified: Secondary | ICD-10-CM | POA: Diagnosis not present

## 2018-06-02 DIAGNOSIS — R1310 Dysphagia, unspecified: Secondary | ICD-10-CM | POA: Diagnosis not present

## 2018-06-02 DIAGNOSIS — Z930 Tracheostomy status: Secondary | ICD-10-CM | POA: Diagnosis not present

## 2018-06-02 DIAGNOSIS — I428 Other cardiomyopathies: Secondary | ICD-10-CM | POA: Diagnosis not present

## 2018-06-02 DIAGNOSIS — G9340 Encephalopathy, unspecified: Secondary | ICD-10-CM | POA: Diagnosis not present

## 2018-06-02 DIAGNOSIS — Z9981 Dependence on supplemental oxygen: Secondary | ICD-10-CM | POA: Diagnosis not present

## 2018-06-02 DIAGNOSIS — J181 Lobar pneumonia, unspecified organism: Secondary | ICD-10-CM | POA: Diagnosis not present

## 2018-06-02 DIAGNOSIS — L89151 Pressure ulcer of sacral region, stage 1: Secondary | ICD-10-CM | POA: Diagnosis present

## 2018-06-02 DIAGNOSIS — J690 Pneumonitis due to inhalation of food and vomit: Secondary | ICD-10-CM | POA: Diagnosis not present

## 2018-06-02 DIAGNOSIS — M6281 Muscle weakness (generalized): Secondary | ICD-10-CM | POA: Diagnosis not present

## 2018-06-02 DIAGNOSIS — I471 Supraventricular tachycardia: Secondary | ICD-10-CM | POA: Diagnosis not present

## 2018-06-02 DIAGNOSIS — J918 Pleural effusion in other conditions classified elsewhere: Secondary | ICD-10-CM | POA: Diagnosis not present

## 2018-06-02 DIAGNOSIS — I509 Heart failure, unspecified: Secondary | ICD-10-CM | POA: Diagnosis not present

## 2018-06-02 DIAGNOSIS — J189 Pneumonia, unspecified organism: Secondary | ICD-10-CM | POA: Diagnosis not present

## 2018-06-02 DIAGNOSIS — I5033 Acute on chronic diastolic (congestive) heart failure: Secondary | ICD-10-CM | POA: Diagnosis not present

## 2018-06-02 DIAGNOSIS — Z888 Allergy status to other drugs, medicaments and biological substances status: Secondary | ICD-10-CM | POA: Diagnosis not present

## 2018-06-02 DIAGNOSIS — I5021 Acute systolic (congestive) heart failure: Secondary | ICD-10-CM | POA: Diagnosis present

## 2018-06-02 DIAGNOSIS — J9690 Respiratory failure, unspecified, unspecified whether with hypoxia or hypercapnia: Secondary | ICD-10-CM | POA: Diagnosis not present

## 2018-06-02 DIAGNOSIS — I952 Hypotension due to drugs: Secondary | ICD-10-CM | POA: Diagnosis not present

## 2018-06-02 DIAGNOSIS — J9811 Atelectasis: Secondary | ICD-10-CM | POA: Diagnosis not present

## 2018-06-02 DIAGNOSIS — R1311 Dysphagia, oral phase: Secondary | ICD-10-CM | POA: Diagnosis not present

## 2018-06-02 DIAGNOSIS — R64 Cachexia: Secondary | ICD-10-CM | POA: Diagnosis not present

## 2018-06-02 DIAGNOSIS — E43 Unspecified severe protein-calorie malnutrition: Secondary | ICD-10-CM | POA: Diagnosis present

## 2018-06-02 DIAGNOSIS — I9589 Other hypotension: Secondary | ICD-10-CM | POA: Diagnosis not present

## 2018-06-02 DIAGNOSIS — R4189 Other symptoms and signs involving cognitive functions and awareness: Secondary | ICD-10-CM | POA: Diagnosis not present

## 2018-06-02 LAB — GLUCOSE, CAPILLARY
GLUCOSE-CAPILLARY: 117 mg/dL — AB (ref 70–99)
GLUCOSE-CAPILLARY: 150 mg/dL — AB (ref 70–99)
GLUCOSE-CAPILLARY: 154 mg/dL — AB (ref 70–99)
Glucose-Capillary: 120 mg/dL — ABNORMAL HIGH (ref 70–99)
Glucose-Capillary: 105 mg/dL — ABNORMAL HIGH (ref 70–99)

## 2018-06-02 LAB — BASIC METABOLIC PANEL
ANION GAP: 8 (ref 5–15)
BUN: 24 mg/dL — AB (ref 8–23)
CO2: 42 mmol/L — ABNORMAL HIGH (ref 22–32)
Calcium: 9.3 mg/dL (ref 8.9–10.3)
Chloride: 93 mmol/L — ABNORMAL LOW (ref 98–111)
Creatinine, Ser: 0.37 mg/dL — ABNORMAL LOW (ref 0.44–1.00)
GFR calc Af Amer: >60 mL/min (ref >60)
GLUCOSE: 158 mg/dL — AB (ref 70–99)
Potassium: 3.4 mmol/L — ABNORMAL LOW (ref 3.5–5.1)
Sodium: 143 mmol/L (ref 135–145)

## 2018-06-02 LAB — CBC
HCT: 27.1 % — ABNORMAL LOW (ref 36.0–46.0)
HEMOGLOBIN: 7.9 g/dL — AB (ref 12.0–15.0)
MCH: 28.5 pg (ref 26.0–34.0)
MCHC: 29.2 g/dL — ABNORMAL LOW (ref 30.0–36.0)
MCV: 97.8 fL (ref 80.0–100.0)
PLATELETS: 445 10*3/uL — AB (ref 150–400)
RBC: 2.77 MIL/uL — AB (ref 3.87–5.11)
RDW: 14.7 % (ref 11.5–15.5)
WBC: 15 10*3/uL — ABNORMAL HIGH (ref 4.0–10.5)
nRBC: 0 % (ref 0.0–0.2)

## 2018-06-02 LAB — PHOSPHORUS: Phosphorus: 3.6 mg/dL (ref 2.5–4.6)

## 2018-06-02 LAB — MAGNESIUM: Magnesium: 2.1 mg/dL (ref 1.7–2.4)

## 2018-06-02 MED ORDER — HEPARIN SODIUM (PORCINE) 5000 UNIT/ML IJ SOLN: 5000.0000 [IU] | Freq: Two times a day (BID) | INTRAMUSCULAR | Status: AC

## 2018-06-02 MED ORDER — VITAL AF 1.2 CAL PO LIQD: 1500.0000 mL | ORAL | Status: AC

## 2018-06-02 MED ORDER — FUROSEMIDE 10 MG/ML IJ SOLN
40.0000 mg | Freq: Four times a day (QID) | INTRAMUSCULAR | Status: DC
  Administered 2018-06-02 (×2): 40 mg via INTRAVENOUS
  Filled 2018-06-02 (×2): qty 4

## 2018-06-02 MED ORDER — METOLAZONE 5 MG PO TABS
5.0000 mg | ORAL_TABLET | Freq: Once | ORAL | Status: AC
  Administered 2018-06-02: 5 mg via ORAL
  Filled 2018-06-02: qty 1

## 2018-06-02 MED ORDER — ALPRAZOLAM 0.25 MG PO TABS: 0.2500 mg | ORAL_TABLET | Freq: Three times a day (TID) | ORAL | 0 refills | Status: AC

## 2018-06-02 MED ORDER — POTASSIUM CHLORIDE 20 MEQ PO PACK
40.0000 meq | PACK | Freq: Two times a day (BID) | ORAL | Status: DC
  Administered 2018-06-02: 40 meq
  Filled 2018-06-02: qty 2

## 2018-06-02 MED ORDER — NOREPINEPHRINE 16 MG/250ML-% IV SOLN: 0.0000 ug/min | INTRAVENOUS | Status: AC

## 2018-06-02 MED ORDER — CHLORHEXIDINE GLUCONATE 0.12% ORAL RINSE (MEDLINE KIT): 15.0000 mL | Freq: Two times a day (BID) | OROMUCOSAL | 0 refills | Status: AC

## 2018-06-02 MED ORDER — ASPIRIN 81 MG PO CHEW: 81.0000 mg | CHEWABLE_TABLET | Freq: Every day | ORAL | Status: AC

## 2018-06-02 MED ORDER — LEVALBUTEROL HCL 0.63 MG/3ML IN NEBU: 0.6300 mg | INHALATION_SOLUTION | RESPIRATORY_TRACT | 12 refills | Status: AC

## 2018-06-02 MED ORDER — AMIODARONE HCL 200 MG PO TABS: 200.0000 mg | ORAL_TABLET | Freq: Two times a day (BID) | ORAL | Status: AC

## 2018-06-02 MED ORDER — ACETAMINOPHEN 160 MG/5ML PO SOLN: 650.0000 mg | ORAL | 0 refills | Status: AC | PRN

## 2018-06-02 MED ORDER — POTASSIUM CHLORIDE 20 MEQ/15ML (10%) PO SOLN
20.0000 meq | ORAL | Status: AC
  Administered 2018-06-02 (×2): 20 meq
  Filled 2018-06-02 (×2): qty 15

## 2018-06-02 MED ORDER — PANTOPRAZOLE SODIUM 40 MG PO PACK: 40.0000 mg | PACK | Freq: Every day | ORAL | Status: AC

## 2018-06-02 MED ORDER — DEXMEDETOMIDINE HCL IN NACL 400 MCG/100ML IV SOLN: 0.4000 ug/kg/h | INTRAVENOUS | Status: AC

## 2018-06-02 MED ORDER — FENTANYL 2500MCG IN NS 250ML (10MCG/ML) PREMIX INFUSION: 25.0000 ug/h | INTRAVENOUS | Status: AC

## 2018-06-02 MED ORDER — POTASSIUM CHLORIDE 20 MEQ PO PACK: 40.0000 meq | PACK | Freq: Two times a day (BID) | ORAL | Status: AC

## 2018-06-02 MED ORDER — FENTANYL BOLUS VIA INFUSION: 25.0000 ug | INTRAVENOUS | 0 refills | Status: AC | PRN

## 2018-06-02 MED ORDER — ORAL CARE MOUTH RINSE: 15.0000 mL | Freq: Four times a day (QID) | OROMUCOSAL | 0 refills | Status: AC

## 2018-06-02 MED ORDER — SODIUM CHLORIDE 0.9% FLUSH: 10.0000 mL | INTRAVENOUS | Status: AC | PRN

## 2018-06-02 MED ORDER — INSULIN ASPART 100 UNIT/ML ~~LOC~~ SOLN: 0.0000 [IU] | SUBCUTANEOUS | 11 refills | Status: AC

## 2018-06-02 MED ORDER — FUROSEMIDE 10 MG/ML IJ SOLN: 40.0000 mg | Freq: Four times a day (QID) | INTRAMUSCULAR | 0 refills | Status: AC

## 2018-06-02 MED ORDER — CHLORHEXIDINE GLUCONATE CLOTH 2 % EX PADS: 6.0000 | MEDICATED_PAD | Freq: Every day | CUTANEOUS | Status: AC

## 2018-06-02 NOTE — Discharge Summary (Signed)
Physician Discharge Summary         Patient ID: Angel Hinton MRN: [id number] DOB/AGE: 76-Nov-1942 76 y.o.  Admit date: 05/23/2018 Discharge date: 06/02/2018  Discharge Diagnoses:    Acute on chronic respiratory failure secondary to spontaneous pneumothorax Severe chronic obstructive pulmonary disease Failure to wean from mechanical ventilation Tracheostomy dependence Coronary artery disease Acute systolic cardiomyopathy  Fever Severe deconditioning Routine calorie malnutrition Hypotension  Discharge summary   77 year old female patient with chronic respiratory failure in the setting of COPD last FEV1 estimated at 37%, she is on oxygen at baseline.  Was admitted on 10/7 with chief complaint of shortness of breath saturations on 2 L were 38% this improved to 72% on 4 L initially had EKG changes worrisome for inferior ST elevation.  Initial chest x-ray showed large right-sided pneumothorax.  She was complaining of respiratory failure, right 14 French pigtail catheter was placed in the emergency room, she was admitted to the intensive care with working diagnosis of acute respiratory failure due to acute exacerbation of COPD and spontaneous right pneumothorax with tension physiology, further complicated by inferior EKG changes. On 10/8 , she was spiking fever chest x-ray still showed small residual right pneumothorax, echocardiogram was evaluated showing ejection fraction 25 to 30% and a pattern consistent with stress cardiomyopathy/Takotsubo syndrome.  Cardiology recommended to continue aspirin, statin, and heparin but did not feel given her comorbidities she was a candidate for cardiac catheterization low-dose beta-blockade was added.  Sputum cultures were sent, procalcitonin was elevated and therefore empiric antibiotics (zosyn)initiated.  Had SVT requiring amiodarone infusion.  She also developed progressive hypotension requiring central venous access and vasoactive drip support. 10/9:  Anabiotic's continued, diuresis started. 10/11: Had brief episode of asystole.  CPR required for approximately 10 seconds before return of spontaneous circulation.  Appears as though this may have been hypoxia related.  10/11 through 10/14: Aggressively diuresed, still requiring high PEEP and FiO2 10/16: Family conference, appeared close to extubation however we felt that risk of reintubation was quite high.   10/16: Did not tolerate weaning but given patient's desire to continue aggressive support decision was made to go forward with tracheostomy if this was placed by Dr. Nelda Marseille on the 16th. On 10/17: Continues to have chest tube.  Still requiring ventilatory support, chest tube has been kept in place in spite of resolution of pneumothorax given concern about need for positive pressure.  Still requiring very low-dose norepinephrine.  LTAC evaluation was made, it was felt she is ready for discharge with the plan as described below per active issues   Discharge Plan by Active Problems     Acute on chronic respiratory failure secondary to tension pneumothorax and acute exacerbation chronic obstructive pulmonary disease now with failure to wean from mechanical ventilation and tracheostomy dependence -Tracheostomy placed on 10/16. -Tolerating weaning efforts poorly -Pneumothorax has resolved, still has chest tube in place without air leak -Will require prolonged ventilator weaning approach given her advanced lung disease, deconditioning, and malnutrition. Plan/rec Transfer to LTAC setting Focus on rehabilitation efforts including PT, OT and nutritional support Daily assessment for weaning Would keep chest tube in until at least being able to be off positive pressure for a few hours a day, currently on 20 cm water suction Continue scheduled bronchodilators Eventually need speech-language pathology evaluation Tracheostomy sutures can be removed on 10/22  Hypotension -Suspect this is  drug-related Plan Continue norepinephrine Would titrate for systolic greater than 90 Could consider midodrine via tube, will defer this to [facility] team  Systolic cardiomyopathy felt tach as he was in room with a EF 25 to 30%. Plan Deemed not a cardiac catheterization candidate Continue to wean norepinephrine Continuing  Lipitor Diuresing with Lasix and Zaroxolyn Continue telemetry monitoring  SVT Plan Continuing Lipitor  Severe deconditioning following prolonged critical illness Plan Needs PT and OT  Severe protein calorie malnutrition Plan Continue tube feeds, may need PEG  Hyperglycemia Plan Sliding scale insulin  Anxiety Plan Continuing current medications as listed below  Significant Hospital tests/ studies  Echo 10/7>> Normal LV size with EF 25-30%. Wall motion abnormalities as noted above. Pattern is suggestive of stress (Takotsubo) cardiomyopathyversus severe multivessel coronary disease. Moderate diastolicdysfunction. Normal RV size and systolic function. Dilated IVCsuggestive of elevated RV filling pressure. Pulmonary arteries: PA peak pressure: 39 mm Hg (S).  Procedures   Pigtail chest tube right placed 10/7>>> ETT 10/7>>>10/16 Trach (JY) 10/16>>> L IJ TLC 10/8>>> Culture data/antimicrobials     10/7: Respiratory culture pending 10/7>> MRSA PCR >> Neg 10/7 Blood Culture >> Pending  Consults  Cardiology  Discharge Exam: Blood Pressure (Abnormal) 137/43   Pulse 81   Temperature 98.5 F (36.9 C) (Oral)   Respiration 19   Height 5' 5"  (1.651 m)   Weight 42.8 kg   Oxygen Saturation 95%   Body Mass Index 15.70 kg/m    Neuro: Is chronically ill 77 year old female currently resting on full ventilator support HEENT normocephalic/atraumatic has a size 6 tracheostomy tube which is coughed this is unremarkable sutures are intact Pulmonary: Decreased right greater than left no air leak noted from pigtail catheter Cardiac: Regular rate and  rhythm Abdomen: Soft nontender Extremity: Warm and dry brisk capillary refill Neuro: Awake and oriented Labs at discharge   Lab Results  Component Value Date   CREATININE 0.37 (L) 06/02/2018   BUN 24 (H) 06/02/2018   NA 143 06/02/2018   K 3.4 (L) 06/02/2018   CL 93 (L) 06/02/2018   CO2 42 (H) 06/02/2018   Lab Results  Component Value Date   WBC 15.0 (H) 06/02/2018   HGB 7.9 (L) 06/02/2018   HCT 27.1 (L) 06/02/2018   MCV 97.8 06/02/2018   PLT 445 (H) 06/02/2018   Lab Results  Component Value Date   ALT 19 05/29/2018   AST 17 05/29/2018   ALKPHOS 64 05/29/2018   BILITOT 0.7 05/29/2018   Lab Results  Component Value Date   INR 0.92 05/23/2018    Current radiological studies    Dg Chest Port 1 View  Result Date: 06/01/2018 CLINICAL DATA:  Tracheostomy tube placement EXAM: PORTABLE CHEST 1 VIEW COMPARISON:  06/01/2018 FINDINGS: Tracheostomy tube in satisfactory position. Right jugular central venous catheter with the tip projecting over the SVC. Nasogastric tube with the distal aspect not visualized. Right-sided chest tube in satisfactory unchanged position. Small bilateral pleural effusions. No pneumothorax. Bilateral lower lobe hazy airspace disease, right worse than left. Bilateral hyperinflated lungs consistent with underlying COPD. Stable cardiomediastinal silhouette. No aggressive osseous lesion. IMPRESSION: 1. Tracheostomy tube in satisfactory position. Right jugular central venous catheter with the tip projecting over the SVC. Nasogastric tube with the distal aspect not visualized. 2. Right-sided chest tube in satisfactory unchanged position without a pneumothorax. 3. Small bilateral pleural effusions and persistent bilateral lower lobe airspace disease. 4. COPD. Electronically Signed   By: Kathreen Devoid   On: 06/01/2018 10:42   Dg Chest Port 1 View  Result Date: 06/01/2018 CLINICAL DATA:  Respiratory failure, intubation EXAM: PORTABLE CHEST 1 VIEW COMPARISON:  Portable  exam 0538 hours compared to 05/31/2018 at 0530 hours FINDINGS: Tip of endotracheal tube projects 7.0 cm above carina. Nasogastric tube extends into stomach. RIGHT jugular catheter tip projects over SVC. Pigtail RIGHT thoracostomy tube. Normal heart size, mediastinal contours, and pulmonary vascularity. Atherosclerotic calcification aorta. BILATERAL pulmonary infiltrates. Underlying COPD changes. Bibasilar effusions and atelectasis. No pneumothorax. IMPRESSION: COPD changes with persistent BILATERAL pulmonary infiltrates and bibasilar effusions. Electronically Signed   By: Lavonia Dana M.D.   On: 06/01/2018 10:19    Disposition:    Discharge disposition: 02-Transferred to [facility]       Discharge Instructions    Diet - low sodium heart healthy   Complete by:  As directed    Increase activity slowly   Complete by:  As directed      Allergies as of 06/02/2018    Allergen Reactions Comment   Erythromycin Anaphylaxis    Amoxicillin Other (See Comments) Not sure   Amoxicillin-pot Clavulanate Other (See Comments) Not sure   Prednisone  Elevates bp   Spiriva Handihaler [tiotropium Bromide Monohydrate]        Medication List    Stop taking these medications   acetaminophen 500 MG tablet Commonly known as:  TYLENOL Replaced by:  acetaminophen 160 MG/5ML solution   hydrochlorothiazide 25 MG tablet Commonly known as:  HYDRODIURIL   UNABLE TO FIND     Take these medications   acetaminophen 160 MG/5ML solution Commonly known as:  TYLENOL Place 20.3 mLs (650 mg total) into feeding tube every 4 (four) hours as needed for fever. Replaces:  acetaminophen 500 MG tablet   ALPRAZolam 0.25 MG tablet Commonly known as:  XANAX Place 1 tablet (0.25 mg total) into feeding tube every 8 (eight) hours.   amiodarone 200 MG tablet Commonly known as:  PACERONE Take 1 tablet (200 mg total) by mouth 2 (two) times daily.   aspirin 81 MG chewable tablet Place 1 tablet (81 mg total) into  feeding tube daily. Start taking on:  06/03/2018   chlorhexidine gluconate (MEDLINE KIT) 0.12 % solution Commonly known as:  PERIDEX 15 mLs by Mouth Rinse route 2 (two) times daily.   Chlorhexidine Gluconate Cloth 2 % Pads Apply 6 each topically daily.   dexmedetomidine 400 MCG/100ML Soln Commonly known as:  PRECEDEX Inject 17.6-52.8 mcg/hr into the vein continuous.   feeding supplement (VITAL AF 1.2 CAL) Liqd Place 1,500 mLs into feeding tube continuous.   fentaNYL Soln Commonly known as:  SUBLIMAZE Inject 25 mcg into the vein every hour as needed.   fentaNYL 10 mcg/ml Soln infusion Inject 25-400 mcg/hr into the vein continuous.   furosemide 10 MG/ML injection Commonly known as:  LASIX Inject 4 mLs (40 mg total) into the vein every 6 (six) hours.   heparin 5000 UNIT/ML injection Inject 1 mL (5,000 Units total) into the skin every 12 (twelve) hours.   insulin aspart 100 UNIT/ML injection Commonly known as:  novoLOG Inject 0-15 Units into the skin every 4 (four) hours.   levalbuterol 0.63 MG/3ML nebulizer solution Commonly known as:  XOPENEX Take 3 mLs (0.63 mg total) by nebulization every 4 (four) hours.   mouth rinse Liqd solution 15 mLs by Mouth Rinse route 4 (four) times daily.   norepinephrine 16-5 MG/250ML-% Soln Commonly known as:  LEVOPHED Inject 0-40 mcg/min into the vein continuous.   pantoprazole sodium 40 mg/20 mL Pack Commonly known as:  PROTONIX Place 20 mLs (40 mg total) into feeding tube daily. Start taking on:  06/03/2018  potassium chloride 20 MEQ packet Commonly known as:  KLOR-CON Place 40 mEq into feeding tube 2 (two) times daily.   sodium chloride flush 0.9 % Soln Commonly known as:  NS 10-40 mLs by Intracatheter route as needed (flush).      Follow-up appointment   Should be referred back to Dr. Baltazar Apo following discharge from [facility], if tracheostomy still in place feel free to contact [facility] tracheostomy clinic Discharge  Condition:    fair  Physician Statement:   The Patient was personally examined, the discharge assessment and plan has been personally reviewed and I agree with ACNP Trenee Igoe's assessment and plan. 45 minutes of time have been dedicated to discharge assessment, planning and discharge instructions.   Signed: Clementeen Graham 06/02/2018, 3:50 PM

## 2018-06-02 NOTE — Progress Notes (Signed)
[facility] ADULT ICU REPLACEMENT PROTOCOL FOR AM LAB REPLACEMENT ONLY  The patient does apply for the [facility] Adult ICU Electrolyte Replacment Protocol based on the criteria listed below:   1. Is GFR >/= 40 ml/min? Yes.    Patient's GFR today is >60 2. Is urine output >/= 0.5 ml/kg/hr for the last 6 hours? Yes.   Patient's UOP is 2 ml/kg/hr 3. Is BUN < 60 mg/dL? Yes.    Patient's BUN today is 24 4. Abnormal electrolyte(s): K-3.4 5. Ordered repletion with: per protocol 6. If a panic level lab has been reported, has the CCM MD in charge been notified? Yes.  .   Physician:  Dr. Harlene Ramus, Philis Nettle 06/02/2018 5:41 AM

## 2018-06-02 NOTE — Progress Notes (Signed)
RT note: upon arrival to patient room patient noted to be using accessory muscles and having increased work of breathing.  Placed patient back on full support ventilator settings and is currently tolerating well.  Will continue to monitor.

## 2018-06-02 NOTE — Progress Notes (Signed)
Pt discharged to [facility] Report called to [facility]. VSS. NAD. All belongings including clothes and rimless glasses transferred with patient. Family aware of discharge and room number.

## 2018-06-02 NOTE — Progress Notes (Signed)
RT note: patient placed on CPAP/PSV of 15/8 at 07:45, currently tolerating well.  Will continue to monitor.

## 2018-06-02 NOTE — Progress Notes (Signed)
PULMONARY / CRITICAL CARE MEDICINE   NAME:  Angel Hinton, MRN:  [id number], DOB:  May 31, 1941, LOS: 72 ADMISSION DATE:  05/23/2018, CONSULTATION DATE:  REFERRING MD:  ED, CHIEF COMPLAINT: Dyspnea  BRIEF HISTORY:    77 year old with severe COPD at baseline who presented with dyspnea and desaturation.  She was found to have EKG changes consistent with an acute inferior myocardial infarction but due to her concurrent morbidities it was elected not to perform cardiac catheterization.  She was intubated and found to have a tension pneumothorax s/p chest tube.  Unable to wean, trach placed 10/16 after discussion with family.  SIGNIFICANT EVENTS:  Intubated 10/7>>>10/16 Trach 10/16>>>  STUDIES:   Echo 10/7>> Normal LV size with EF 25-30%. Wall motion abnormalities as noted   above. Pattern is suggestive of stress (Takotsubo) cardiomyopathy   versus severe multivessel coronary disease. Moderate diastolic   dysfunction. Normal RV size and systolic function. Dilated IVC   suggestive of elevated RV filling pressure. Pulmonary arteries: PA peak pressure: 39 mm Hg (S).  CULTURES:  10/7: Respiratory culture pending 10/7>> MRSA PCR >> Neg 10/7 Blood Culture >> Pending  ANTIBIOTICS:  Zosyn 10/x>>>10/14  LINES/TUBES:  Pigtail chest tube right placed 10/7>>> ETT 10/7>>>10/16 Trach (JY) 10/16>>> L IJ TLC 10/8>>>  CONSULTANTS:  Cardiology 10/7- recommend medical management  SUBJECTIVE:  On PSV 15/8 Uneventful night. Comfortable on the vent.  Awakens to commands.  Denies any pain, discomfort.  CONSTITUTIONAL: BP (!) 125/43   Pulse 76   Temp 98.3 F (36.8 C) (Oral)   Resp 19   Ht 5\' 5"  (1.651 m)   Wt 42.8 kg   SpO2 (!) 88%   BMI 15.70 kg/m   I/O last 3 completed shifts: In: 2315 [I.V.:780; NG/GT:1485; IV Piggyback:50] Out: 9371 [Urine:4670; Chest Tube:60]  CVP:  [3 mmHg-8 mmHg] 3 mmHg  Vent Mode: PSV;CPAP FiO2 (%):  [50 %-80 %] 50 % Set Rate:  [16 bmp] 16 bmp Vt Set:  [450  mL] 450 mL PEEP:  [8 cmH20] 8 cmH20 Pressure Support:  [15 cmH20] 15 cmH20 Plateau Pressure:  [12 cmH20-19 cmH20] 17 cmH20  PHYSICAL EXAM: Gen:      Frail, elderly. HEENT:  EOMI, sclera anicteric Neck:     No masses; no thyromegaly, trach site clear with no bleed. Lungs:    Clear to auscultation bilaterally; normal respiratory effort CV:         Regular rate and rhythm; no murmurs Abd:      + bowel sounds; soft, non-tender; no palpable masses, no distension Ext:    No edema; adequate peripheral perfusion Skin:      Warm and dry; no rash Neuro: Somnolent, arousable.  No focal deficits.  RESOLVED PROBLEM LIST   ASSESSMENT AND PLAN   Acute on Chronic Respiratory Failure 2/2 tension pneumothorax Severe COPD Prolonged respiratory failure, S/p trach Pressure support weans as tolerated Keep chest tube in place for now she is on positive pressure.   Coronary artery disease, elevated troponins Cardiomyopathy with EF 25-30%, Pattern is suggestive of stress (Takotsubo) cardiomyopathy versus severe multivessel coronary disease. Moderate diastolic dysfunction.   Cardiology signed off.  Not a candidate for cardiac cath Wean Levophed as tolerated Diuresis Continue amiodarone, lipitor  Renal Continue Lasix, zaroxylyn for diuresis Follow urine output and creatinine.   ID Fever resolved and infiltrate is difficult to ascertain Zosyn was discontinued on 05/30/2018 Monitor temperature and fever curve  GI/ Nutrition Tube feeds  Goals of Care Full code status  SUMMARY OF  TODAY'S PLAN:  Pressure support weans Lasix for diuresis.  Best Practice / Goals of Care / Disposition.   DVT PROPHYLAXIS: SQ heparin MOQ:[id number] NUTRITION: Tube feedings at 45 cc an hour MOBILITY:BR/ OOB to chair GOALS OF CARE: Ongoing discussion   LABS  Glucose Recent Labs  Lab 06/01/18 0747 06/01/18 1139 06/01/18 1513 06/01/18 1957 06/02/18 0357 06/02/18 0738  GLUCAP 127* 154* 123* 129* 154* 120*     BMET Recent Labs  Lab 05/31/18 0447 06/01/18 0426 06/02/18 0433  NA 145 143 143  K 3.3* 4.2 3.4*  CL 94* 98 93*  CO2 39* 41* 42*  BUN 18 24* 24*  CREATININE 0.33* 0.32* 0.37*  GLUCOSE 136* 137* 158*    Liver Enzymes Recent Labs  Lab 05/28/18 0406 05/29/18 1815  AST 16 17  ALT 22 19  ALKPHOS 80 64  BILITOT 0.7 0.7  ALBUMIN 2.1* 3.0*    Electrolytes Recent Labs  Lab 05/31/18 0447 06/01/18 0426 06/02/18 0433  CALCIUM 9.0 8.9 9.3  MG 1.7 2.3 2.1  PHOS 2.8 2.8 3.6    CBC Recent Labs  Lab 05/30/18 0457 05/31/18 0447 06/02/18 0433  WBC 13.0* 15.1* 15.0*  HGB 7.8* 8.1* 7.9*  HCT 26.3* 26.9* 27.1*  PLT 327 368 445*    ABG Recent Labs  Lab 05/28/18 0005 05/29/18 0349 05/31/18 0341  PHART 7.374 7.483* 7.438  PCO2ART 51.8* 47.2 62.7*  PO2ART 63.0* 63.0* 71.0*    Coag's No results for input(s): APTT, INR in the last 168 hours.  Sepsis Markers Recent Labs  Lab 05/28/18 0406 05/29/18 0647 05/30/18 0457  PROCALCITON 0.69 0.45 0.22    Cardiac Enzymes No results for input(s): TROPONINI, PROBNP in the last 168 hours.  The patient is critically ill with multiple organ system failure and requires high complexity decision making for assessment and support, frequent evaluation and titration of therapies, advanced monitoring, review of radiographic studies and interpretation of complex data.   Critical Care Time devoted to patient care services, exclusive of separately billable procedures, described in this note is 35 minutes.   Marshell Garfinkel MD [facility] Pager [phone] If no answer or after 3pm call: [phone] 06/02/2018, 8:43 AM

## 2018-06-03 LAB — GLUCOSE, CAPILLARY: Glucose-Capillary: 129 mg/dL — ABNORMAL HIGH (ref 70–99)

## 2018-06-05 DIAGNOSIS — I5021 Acute systolic (congestive) heart failure: Secondary | ICD-10-CM

## 2018-06-05 DIAGNOSIS — R6521 Severe sepsis with septic shock: Secondary | ICD-10-CM

## 2018-06-05 DIAGNOSIS — J449 Chronic obstructive pulmonary disease, unspecified: Secondary | ICD-10-CM

## 2018-06-05 DIAGNOSIS — J9621 Acute and chronic respiratory failure with hypoxia: Secondary | ICD-10-CM

## 2018-06-06 DIAGNOSIS — J9621 Acute and chronic respiratory failure with hypoxia: Secondary | ICD-10-CM

## 2018-06-06 DIAGNOSIS — R6521 Severe sepsis with septic shock: Secondary | ICD-10-CM

## 2018-06-06 DIAGNOSIS — I5021 Acute systolic (congestive) heart failure: Secondary | ICD-10-CM

## 2018-06-06 DIAGNOSIS — J449 Chronic obstructive pulmonary disease, unspecified: Secondary | ICD-10-CM

## 2018-06-07 DIAGNOSIS — R6521 Severe sepsis with septic shock: Secondary | ICD-10-CM

## 2018-06-07 DIAGNOSIS — J9621 Acute and chronic respiratory failure with hypoxia: Secondary | ICD-10-CM

## 2018-06-07 DIAGNOSIS — I5021 Acute systolic (congestive) heart failure: Secondary | ICD-10-CM

## 2018-06-07 DIAGNOSIS — J449 Chronic obstructive pulmonary disease, unspecified: Secondary | ICD-10-CM

## 2018-06-08 DIAGNOSIS — I5021 Acute systolic (congestive) heart failure: Secondary | ICD-10-CM

## 2018-06-08 DIAGNOSIS — J449 Chronic obstructive pulmonary disease, unspecified: Secondary | ICD-10-CM

## 2018-06-08 DIAGNOSIS — R6521 Severe sepsis with septic shock: Secondary | ICD-10-CM

## 2018-06-08 DIAGNOSIS — J9621 Acute and chronic respiratory failure with hypoxia: Secondary | ICD-10-CM

## 2018-06-09 DIAGNOSIS — J9621 Acute and chronic respiratory failure with hypoxia: Secondary | ICD-10-CM

## 2018-06-09 DIAGNOSIS — I5021 Acute systolic (congestive) heart failure: Secondary | ICD-10-CM

## 2018-06-09 DIAGNOSIS — J449 Chronic obstructive pulmonary disease, unspecified: Secondary | ICD-10-CM

## 2018-06-09 DIAGNOSIS — R6521 Severe sepsis with septic shock: Secondary | ICD-10-CM

## 2018-06-10 DIAGNOSIS — R6521 Severe sepsis with septic shock: Secondary | ICD-10-CM

## 2018-06-10 DIAGNOSIS — I5021 Acute systolic (congestive) heart failure: Secondary | ICD-10-CM

## 2018-06-10 DIAGNOSIS — J9621 Acute and chronic respiratory failure with hypoxia: Secondary | ICD-10-CM

## 2018-06-10 DIAGNOSIS — J449 Chronic obstructive pulmonary disease, unspecified: Secondary | ICD-10-CM

## 2018-06-11 DIAGNOSIS — R6521 Severe sepsis with septic shock: Secondary | ICD-10-CM

## 2018-06-11 DIAGNOSIS — J449 Chronic obstructive pulmonary disease, unspecified: Secondary | ICD-10-CM

## 2018-06-11 DIAGNOSIS — I5021 Acute systolic (congestive) heart failure: Secondary | ICD-10-CM

## 2018-06-11 DIAGNOSIS — J9621 Acute and chronic respiratory failure with hypoxia: Secondary | ICD-10-CM

## 2018-07-17 DEATH — deceased
